# Patient Record
Sex: Female | Born: 1965 | Hispanic: No | Marital: Married
Health system: Midwestern US, Academic
[De-identification: ages and names within clinical notes are randomized; demographics above are authoritative.]

---

## 2006-06-24 NOTE — Unmapped (Signed)
Signed by Nestor Lewandowsky MD on 06/24/2006 at 00:00:00  MRI Brain      Imported By: Maryellen Pile 07/18/2006 14:07:46    _____________________________________________________________________    External Attachment:    Please see [employer] EMR for this document.

## 2006-07-12 NOTE — Unmapped (Signed)
Signed by Geanie Berlin MA on 07/12/2006 at 13:55:53    Neurology Preload      Preload Clinical Lists   Medications added:   TRIPHASIL (28) TABS (LEVONORG-ETH ESTRAD TRIPHASIC) daily  AVONEX 30 MCG/VIAL KIT (INTERFERON BETA-1A) 1 every week        Coordinating Care Providers   PCP Name: Dr. Doyce Loose  PCP Address: [address] [place], [place] [address]  Referring Physician: Dr. Doyce Loose    Past History  Past Medical History:  Migraines  Surgical History:  Tonsillectomy: 40 years old, No problems with anesthesia, No problems with healing, No problems with blood clots after surgery, Bunion surgery on left foot in 2004    Family History: Positive for Rheumatiod Arthritis and Migraines.  Social History: Employment Status: employed part-time,   Patient Lives at: home,   Seatbelt Use: 100 % of the time  Alcohol Use: none  Drug Use: none  Tobacco Usage:non-smoker      Diagnostic Testing:   MRI History MRI of the Brain with and without contrast on 06-24-2006.     Stable findings suggesting a demyelinating process such as multiple sclerosis.  Findings are stable compared to 08-02-05.          ]

## 2006-07-14 NOTE — Unmapped (Signed)
Signed by Nestor Lewandowsky MD on 07/14/2006 at 00:00:00  Medical Records Release      Imported By: Maryellen Pile 07/18/2006 11:54:20    _____________________________________________________________________    External Attachment:    Please see [facility] EMR for this document.

## 2006-07-14 NOTE — Unmapped (Signed)
Signed by Nestor Lewandowsky MD on 07/14/2006 at 12:01:19    Multiple Sclerosis Visit      History of Present Illness- Multiple Sclerosis:   Chief Complaint: MS  Handedness: right  HPI: Ms. Shoaff is a 40 yo wf who was diagnosed with MS 9 1/2 years ago. At that time she had some left leg weakness and right hand clumsiness. She had an MRI of the brain and a lumbar puncture at that time and was diagnosed with multiple sclerosis.  The first attack resolved following steroid treatment and she was started on Avonex.  She had no problems until 3 weeks ago when she developed right face, shoulder and arm numbness as well as left sided numbness. She has been followed by Dr. Yevonne Aline since moving to [place], and she was treated with IV steroids and is currently on an oral steroid taper. The patient states that her symptoms are much better, but that she does have odd sensations of heat and cold in her left arm.   Past History  Past Medical History (reviewed - no changes required):  Migraines  Surgical History (reviewed - no changes required):  Tonsillectomy: 40 years old, No problems with anesthesia, No problems with healing, No problems with blood clots after surgery, Bunion surgery on left foot in 2004    Family History (reviewed - no changes required): Positive for Rheumatiod Arthritis and Migraines.  Social History (reviewed - no changes required): Employment Status: employed part-time,   Patient Lives at: home,   Seatbelt Use: 100 % of the time  Alcohol Use: none  Drug Use: none  Tobacco Usage:non-smoker    Review of Systems   General: Denies fevers, chills, sweats, anorexia, fatigue, malaise, weight loss, weight gain, drowsiness, insomnia.   Eyes: Denies blurring, diplopia, irritation, discharge, vision loss, eye pain, photophobia, redness.   Ears/Nose/Throat: Denies any specific issues at this time.   Cardiovascular: Denies any specific issues at this time.   Respiratory: Denies any specific issues at this time.      Gastrointestinal: Denies any specific issues at this time.   Genitourinary: Denies any specific issues at this time.   Musculoskeletal: Denies any specific issues at this time.   Skin: Denies any specific issues at this time.   Neurologic: Denies see HPI, headache, paresthesias, tingling.   Psychiatric: Denies anxiety.   Endocrine: Denies any specific issues at this time.   Heme/Lymphatic: Denies any specific issues at this time.   Allergic/Immunologic: Denies any specific issues at this time.       Intake-Neurology   Chief Complaint: MS  Handedness: right    Vital Signs Weight: 117 pounds  Pulse rate: 60 Pulse rhythm: regular Blood Pressure: Standard    BP #1: 98 / 70mm Hg  Cuff Size: Std     Visual Exam:   Corrective Lenses: none    Allergies  No Known Allergies    Intake recorded by: Geanie Berlin MA  July 14, 2006 9:08 AM      Diagnostic Testing:   MRI History MRI of the Brain with and without contrast on 06-24-2006.     Stable findings suggesting a demyelinating process such as multiple sclerosis.  Findings are stable compared to 08-02-05.        Physical Exam-Neurology   Appearance: well developed, well nourished and in no acute distress  Language/Speech: no aphasia and no dysarthria  Atten/concentration: awake and alert  Orientation: oriented x3  Memory: grossly intact  Fund of Knowledge: grossly  intact  Ophthalmoscopic: no papilledema/good venous pulsations  Pupils: equal, round, reactive to light and accommodation  Visual Fields:   normal  CN 3,4,6 (EOM): extraocular movements intact, normal gaze alignment, and no nystagmus  Cranial Nerve 5 (Trigeminal): bilateral facial sensation normal  Cranial Nerve 7 (Facial): normal facial symmetry and strength  CN 8 (Auditory): proper hearing to finger rub bilaterally  Cranial Nerve 9 (Glossophar): soft palate elevates in the midline spontaneously  CN 11 (spinal access): full shoulder shrug equally  Cranial Nerve 12 (Hypoglossal): tongue is midline on  protrusion    General:   Head and Neck no lymphadenopathy, carotids 2+/no bruits, nl thyroid gland  Pulmonary: clear to ausculation  CVS: RRR, nl S1 S2, no S3 S4, no M  Abdomen: soft, no tenderness, no organomegaly  Extremities: no edema, cynosis, clubbing, pulses 2+    Muscle Strength     Right Upper Extremity   Shoulder abductor (right): 5/5  Elbow flexor (right): 5/5  Elbow extensor (right): 5/5  Wrist Flexors (right): 5/5  Wrist Extensors (right): 5/5  Finger Flexors (right): 5/5  Interosseous (right): 5/5  APB (right): 5/5    Left Upper Extremity   Shoulder abductor (left): 5/5  Elbow flexor (left): 5/5  Elbow extensor (left): 5/5  Wrist Flexors (left): 5/5  Wrist Extensors (left): 5/5  Finger Flexors (left): 5/5  Interosseous (left): 5/5  APB (left): 5/5    Right Lower Extremity   Hip Flexion (right): 5/5  Hip Adduction (right): 5/5  Hip Abduction (right): 5/5  Knee extension (right): 5/5  Knee Flexion (right): 5/5  Ankle Dorsi Flexor (right): 5/5  Pedal Flexor (right): 5/5  Ankle Inverter (right): 5/5  Ankle Everter (right): 5/5    Left Lower Extremity   Hip Flexion (left): 5/5  Hip Adduction(left): 5/5  Hip Abduction (left): 5/5  Knee extension (left): 5/5  Knee Flexion (left): 5/5  Ankle Dorsi Flexor (left): 5/5  Pedal Flexors (left): 5/5  Ankle Inverter (left): 5/5  Ankle Everter (left): 5/5      Assessment and Plan  New Problems:  Dx of MULTIPLE SCLEROSIS (ICD-340)  Onset: 07/14/2006  Assessment    40 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 Her symptoms have now almost resolved following a course of steroids.     Plan    1. Diagnostics - we will check an MRI of the spine with contrast to determine whether there are spinal lesions and determine whether there is active disease in the spinal cord.   2. Treatment - we will continue Avonex for now, but if the spinal MRI demonstrates contrast-enhancing lesions consistent with active disease, we will consider  changing to Rebif.  3. Follow-up - Mrs. Yeager would prefer not to return to the office for her MRI results, so her husband will call to obtain these and be instructed regarding changes in management at that time. We have asked Mrs. Scheeler to follow up in clinic in 6 months,    Today's Orders   [phone] - Ofc Vst, Est Level V [CPT-99215]    Disposition/Follow Up:   Return to clinic in 6 months  Suggested date of next visit: 01/12/2007              ]

## 2006-07-15 NOTE — Unmapped (Signed)
Signed by Geanie Berlin MA on 07/15/2006 at 10:00:05    Clinical Lists Changes    Orders:  Added new Test order of MRI, Spine, Cervical w/ & w/o gadolinium ([id number]) - Signed  Added new Test order of MRI, Spine, Thoracic, w/ & w/o contrast ([id number]) - Signed

## 2006-07-15 NOTE — Unmapped (Signed)
Signed by Geanie Berlin MA on 07/15/2006 at 09:40:05    Clinical Lists Changes    Orders:  Added new Test order of CT, Spine, Cervical , w/ & w/o contrast ([id number]) - Signed  Added new Test order of CT, Spine, Thoracic,  w/ & w/o contrast ([id number]) - Signed    Called patients insurance and per [facility] at insurance no precert required.  To be done at [facility] on 07-29-06 at 12:30pm.

## 2006-07-18 NOTE — Unmapped (Signed)
Signed by Nestor Lewandowsky MD on 07/18/2006 at 00:00:00  MS Patient Questionnaire      Imported By: Maryellen Pile 07/18/2006 11:43:25    _____________________________________________________________________    External Attachment:    Please see [facility] EMR for this document.

## 2006-07-20 NOTE — Unmapped (Signed)
Signed by Lyla Son on 07/20/2006 at 19:41:23                    July 14, 2006          Doyce Loose, M.D.    RE: Haley Ramos   DOB:  1965-12-25    Dear Dr. Dutch Gray:     I had the pleasure of seeing your patient, Haley Ramos, in consultation in the [facility] on 07/14/2006.     History of Present Illness:  Haley Ramos is a 40 year old white female who was diagnosed with MS 9-1/2 years ago. At that time she had some left leg weakness and right hand clumsiness. She had an MRI of the brain and a lumbar puncture at that time and was diagnosed with multiple sclerosis.  The first attack resolved following steroid treatment and she was started on Avonex.  She had no problems until 3 weeks ago when she developed right face, shoulder and arm numbness as well as left-sided numbness. She has been followed by Dr. Yevonne Aline since moving to [place], and she was treated with IV steroids and is currently on an oral steroid taper. The patient states that her symptoms are much better, but that she does have odd sensations of heat and cold in her left arm.        Impression:  40 year old female with relapsing-remitting MS presents with her first flare in over 9 years. Her symptoms have now almost resolved following a course of steroids.     Plan:  1. Diagnostics - we will check an MRI of the spine with contrast to determine whether there are spinal lesions and determine whether there is active disease in the spinal cord.   2. Treatment - we will continue Avonex for now, but if the spinal MRI demonstrates contrast-enhancing lesions consistent with active disease, we will consider changing to Rebif.  3. Follow-up - Haley Ramos would prefer not to return to the office for her MRI results, so her husband will call to obtain these and be instructed regarding changes in management at that time. We have asked Haley Ramos to follow up in clinic in 6 months.      Thank  you for allowing me to participate in the care of this pleasant patient.  If you would like a copy of my office note, please contact our Medical Records department at [phone].  Please feel free to contact me should you have any concerns or questions.    Sincerely,      Hulan Fess, MD

## 2006-07-29 ENCOUNTER — Inpatient Hospital Stay

## 2006-07-29 NOTE — Unmapped (Signed)
Signed by   [employer] on 07/29/2006 at 17:37:04  Patient: Haley Ramos  Note: All result statuses are Final unless otherwise noted.    Tests: (1) MRI-C-SPINE W/WO CONTRAST [phone])    Order NotePricilla Handler Order Number: [id number]    Order Note:     *** VERIFIED ***  [facility]  Reason:  C & T SPINE W/WO DX. [facility])  Dict.Staff: Sheral Apley [phone]  Dict.Res: Arnoldo Hooker [id number]  Verified By: Sheral Apley        Ver: 07/29/06   5:39 pm  Exams:  MRI-C-SPINE W/WO CONTRAST  MRI-T-SPINE W/WO CONTRAST    STUDY: MRI CERVICAL SPINE WITH AND WITHOUT CONTRAST    History: Multiple sclerosis.    Comparison: None available.    Technique: Multisequence sagittal and axial images of the  cervical spine were obtained from the skull base to the T3-T4  level.    Findings: There are 2 T2 hyperintense lesions in the cervical  cord. The lesion at the cervicomedullary junction on the right  side of the cord measure 1 x 0.6 cms and demonstrates contrast  enhancement. The other lesion is at the C3-C4 level and does not  enhance.    Vertebral body height and alignment is normal. The bone marrow  signal is normal. Prevertebral soft tissues are normal.    At C5-C6: There is a small broad based left paracentral disc  protrusion that compresses the ventrolateral thecal sac on the  left. There is no spinal canal or foraminal stenosis.    In the remainder of the cervical spine: The disc is normal.  There is no spinal canal/foraminal stenosis or nerve  compression.    Impression:  1. Two hyperintense T2 lesions in the cervical cord at the  cervicomedullary junction and at the C3-C4 level, with the  cervicomedullary junction lesion demonstrating enhancement  consistent with multiple sclerosis plaques.  2. Disc protrusion at C5-C6 as described above.      STUDY: MRI THORACIC SPINE WITH AND WITHOUT CONTRAST    History: Multiple sclerosis    Comparison: None available.    Technique: Multisequence sagittal and axial images of  the  thoracic spine were obtained from the C7-T1 to T12-L1.    Findings: Vertebral body height and alignment is normal. The  bone marrow signal is normal.  There is no definite abnormal T2  signal in the thoracic cord. Paravertebral soft tissues are  normal.    At T7-T8: There is a small central right disc  protrusion/extrusion with some suggestion of superior extension  that results in ventral thecal sac compression. There is no  spinal canal or foraminal stenosis.    In the remainder of the thoracic spine: The disc is normal.  There is no spinal canal/foraminal stenosis or nerve  compression.    Impression:  1. Small disc protrusion/extrusion at T7-T8.  2. No definite abnormal signal in the thoracic cord.                                            **** end of result ****    Note: An exclamation mark (!) indicates a result that was not dispersed into   the flowsheet.  Document Creation Date: 07/29/2006 5:37 PM  _______________________________________________________________________    (1) Order result status: Final  Collection or observation date-time: 07/29/2006 14:43:15  Requested date-time: 07/29/2006 13:00:00  Receipt date-time:   Reported date-time: 07/29/2006 17:39:14  Referring Physician: Trudee Grip  Ordering Physician: Hulan Fess [facility])  Specimen Source:   Source: QRS  Filler Order Number: [id number]  Lab site: [facility]

## 2006-07-29 NOTE — Unmapped (Signed)
Signed by   [employer] on 07/29/2006 at 17:37:05  Patient: Haley Ramos  Note: All result statuses are Final unless otherwise noted.    Tests: (1) MRI-T-SPINE W/WO CONTRAST [id number])    Order NotePricilla Handler Order Number: [id number]    Order Note:     *** VERIFIED ***  [facility]  Reason:  C & T SPINE W/WO DX. [facility])  Dict.Staff: Sheral Apley [phone]  Dict.Res: Arnoldo Hooker [id number]  Verified By: Sheral Apley        Ver: 07/29/06   5:39 pm  Exams:  MRI-C-SPINE W/WO CONTRAST  MRI-T-SPINE W/WO CONTRAST    STUDY: MRI CERVICAL SPINE WITH AND WITHOUT CONTRAST    History: Multiple sclerosis.    Comparison: None available.    Technique: Multisequence sagittal and axial images of the  cervical spine were obtained from the skull base to the T3-T4  level.    Findings: There are 2 T2 hyperintense lesions in the cervical  cord. The lesion at the cervicomedullary junction on the right  side of the cord measure 1 x 0.6 cms and demonstrates contrast  enhancement. The other lesion is at the C3-C4 level and does not  enhance.    Vertebral body height and alignment is normal. The bone marrow  signal is normal. Prevertebral soft tissues are normal.    At C5-C6: There is a small broad based left paracentral disc  protrusion that compresses the ventrolateral thecal sac on the  left. There is no spinal canal or foraminal stenosis.    In the remainder of the cervical spine: The disc is normal.  There is no spinal canal/foraminal stenosis or nerve  compression.    Impression:  1. Two hyperintense T2 lesions in the cervical cord at the  cervicomedullary junction and at the C3-C4 level, with the  cervicomedullary junction lesion demonstrating enhancement  consistent with multiple sclerosis plaques.  2. Disc protrusion at C5-C6 as described above.      STUDY: MRI THORACIC SPINE WITH AND WITHOUT CONTRAST    History: Multiple sclerosis    Comparison: None available.    Technique: Multisequence sagittal and axial images of  the  thoracic spine were obtained from the C7-T1 to T12-L1.    Findings: Vertebral body height and alignment is normal. The  bone marrow signal is normal.  There is no definite abnormal T2  signal in the thoracic cord. Paravertebral soft tissues are  normal.    At T7-T8: There is a small central right disc  protrusion/extrusion with some suggestion of superior extension  that results in ventral thecal sac compression. There is no  spinal canal or foraminal stenosis.    In the remainder of the thoracic spine: The disc is normal.  There is no spinal canal/foraminal stenosis or nerve  compression.    Impression:  1. Small disc protrusion/extrusion at T7-T8.  2. No definite abnormal signal in the thoracic cord.                                            **** end of result ****    Note: An exclamation mark (!) indicates a result that was not dispersed into   the flowsheet.  Document Creation Date: 07/29/2006 5:37 PM  _______________________________________________________________________    (1) Order result status: Final  Collection or observation date-time: 07/29/2006 14:43:08  Requested date-time: 07/29/2006 13:00:00  Receipt date-time:   Reported date-time: 07/29/2006 17:39:14  Referring Physician: Trudee Grip  Ordering Physician: Hulan Fess [facility])  Specimen Source:   Source: QRS  Filler Order Number: [id number]  Lab site: [facility]

## 2006-08-02 NOTE — Unmapped (Signed)
Signed by Geanie Berlin MA on 08/02/2006 at 16:10:10    Phone Note   Patient Call    Caller's Cell Phone #: [phone]  Caller: spouse [facility]  Call for: [facility]    Follow-up for Phone Call      Per the dictation she has enhancing plaques in the spine.  Per the pt's OV note she chose not to come in to discuss the MRI results and discussed the plan that her husband would call to see if there were signs of active disease with the understanding that Dr Grayland Jack recommended switching from Avonex to Rebif if active disease was noted.     I'm assuming all of this was discussed and agreed upon.    Thanks!             Information sent  Follow-up by: Noelle Penner NP,  August 02, 2006 3:37 PM    Reason for call: PT'S HUSBAND CALLING TO SPEAK WITH THE NURSE STATING THEY WERE TOLD TO CALL BACK AFTER ABOUT A WEEK OR SO FOR THE RESULTS OF THE PT'S MRI.     Requesting Test Results:  Test/ Procedure: MRI   Date of test/ procedure: 07/29/06.      Initial call taken by: Marinell Blight,  August 02, 2006 3:06 PM      Follow-up for Phone Call   Please see MRI results and office note and please let me know what to tll the patient.  Thanks.  Called Patient  Follow-up by: Geanie Berlin MA,  August 02, 2006 3:30 PM    Additional Follow-up for Phone Call   Called patient's husband back and gave th information to him.  He was then sent to schdule a appt with Dr. Grayland Jack for a follow-up from the MRI.  Information to start the patient on Rebif was faxed to the company.  Phone Call Completed, Called Patient  Additional Follow-up by: Geanie Berlin MA,  August 02, 2006 4:09 PM

## 2006-08-02 NOTE — Unmapped (Signed)
Signed by Nestor Lewandowsky MD on 08/02/2006 at 00:00:00  Rebif Prescription Form      Imported By: Maryellen Pile 08/15/2006 10:13:09    _____________________________________________________________________    External Attachment:    Please see [facility] EMR for this document.

## 2006-08-03 NOTE — Unmapped (Signed)
Signed by Geanie Berlin MA on 08/03/2006 at 09:30:29    Phone Note   Patient Call    Caller's Cell Phone #: [phone]  Caller: spouse [facility]  Call for: [facility]      Summary of Call:  PTS HUSBAND STATED THAT HE WOULD LIKE TO GET MORE INFO/UNDERSTANDING REGARDING PTS MRI OF SPINE.  STATED THAT PT HAS BEEN EXTREMELY UPSET SINCE TEST RESULTS. PLEASE CALL CELL NUMBER.     Initial call taken by: Blanch Media,  August 03, 2006 8:43 AM      New Orders:  CBC + plat + Diff  (6399) [CPT-85025]  Hepatic Function    (LIVP) (10256) [CPT-80076]  Follow-up for Phone Call   Sp[oke to patient's husband and told him what was stated on the MRI.  Stated that wife was very upset with the news of the plaques in her spine, told husband understandable of this.  Patient's husband wanted to get in today to see Dr. told patient's husband that Dr. was out of town for 3 weeks and that patient was to make a appt before the Dr. was to be gone, but patient's did not want to come back and get results they wanted to be told by phone, and patient was told that we do not like to do this.  We like to have the patient's to return and see the Dr. to get the results.  Patient's husband has some questions that I could not answer so patient's husband was transferred to [facility] and she answered some questions.  Husband told Danielle that patient was on steroids that Dr. Grayland Jack put her on.  Dr. Grayland Jack did not put the patient on the steriods there were given to the patient by Dr. Yevonne Aline, and they were finished on Saturday.  They will call if they have any problems and orders for a CBC and LFT's were mailed to the patient to be done before they start the medication Rebif.  Phone Call Completed, Called Patient  Follow-up by: Geanie Berlin MA,  August 03, 2006 9:30 AM

## 2006-08-18 NOTE — Unmapped (Signed)
Signed by Nestor Lewandowsky MD on 08/18/2006 at 00:00:00  Lab Report      Imported By: Maryellen Pile 08/29/2006 13:47:05    _____________________________________________________________________    External Attachment:    Please see [employer] EMR for this document.

## 2006-09-08 NOTE — Unmapped (Signed)
Signed by Nestor Lewandowsky MD on 11/21/2006 at 09:02:37    Multiple Sclerosis Visit      History of Present Illness- Multiple Sclerosis:   Chief Complaint: 124  Handedness: right  HPI: I re-evaluated Haley Ramos at a follow-up visit in multiple sclerosis clinic on September 08, 2006. The patient is a 41 year old female who was diagnosed with multiple sclerosis many years ago. The patient came to the clinic to review her MRI results. The MRI revealed a contrast-enhancing lesion on the right side of the cord at the cervicomedullary junction as well as a lesion at the C3-4 level which is not enhancing.     The results of the test were discussed with the patient and we suggested switching her from low-dose interferon to high-dose therapy such as Rebif. The patient is agreeable.        x    x   x      Past History  Past Medical History (reviewed - no changes required):  Migraines  Surgical History (reviewed - no changes required):  Tonsillectomy: 41 years old, No problems with anesthesia, No problems with healing, No problems with blood clots after surgery, Bunion surgery on left foot in 2004    Family History (reviewed - no changes required): Positive for Rheumatiod Arthritis and Migraines.  Social History (reviewed - no changes required): Employment Status: employed part-time,   Patient Lives at: home,   Seatbelt Use: 100 % of the time  Alcohol Use: none  Drug Use: none  Tobacco Usage:non-smoker    Review of Systems       Intake-Neurology   Chief Complaint: 124  Handedness: right    Vital Signs Weight: 124 pounds  Pulse rate: 72 Pulse rhythm: regular Blood Pressure: Standard    BP #1: 110 / 72mm Hg  Cuff Size: Std Wt chg: 7      Visual Exam:   Corrective Lenses: glasses    Allergies  No Known Allergies    Intake recorded by: Geanie Berlin MA  September 08, 2006 4:03 PM      Diagnostic Testing:   MRI History MRI of the Brain with and without contrast on 06-24-2006.     Stable findings suggesting a demyelinating  process such as multiple sclerosis.  Findings are stable compared to 08-02-05.          Assessment    A 41 year old female with relapsing-remitting multiple sclerosis and a recent attack and a contrast-enhancing lesion at the cervicomedullary junction. I have suggested that the patient increase the dose of interferon because of the activity of the disease. The patient is agreeable.     x        Plan    1. Stop Avonex and initiate treatment with Rebif.   2. The patient will come back to the clinic in a few months.       x      Today's Orders   99213 - Ofc Vst, Est Level III [[id number]]    Disposition/Follow Up:   Time spent with patient: 30 minutes  Time spent discussing diagnosis, management and treatment plan: >25 minutes    Medical Decision Making:   * review/order radiology tests              ]

## 2006-09-09 NOTE — Unmapped (Signed)
Signed by Geanie Berlin MA on 09/09/2006 at 13:59:55    Phone Note   Patient Call    Call back at Home Phone: [phone]  Caller: patient  Call for: Dr. Grayland Jack    Reason for call: rebif needs rx faxed over so pt can start her meds.       Initial call taken by: Hendricks Milo,  September 09, 2006 12:48 PM      Follow-up for Phone Call   Called patietn back and after she called me about this she was called by the company about getting her started on the medication.  The company was called yesrterday and the papers were refaxed back to the company.  Patient will be starting the medication soon.                                                Phone Call Completed, Called Patient  Follow-up by: Geanie Berlin MA,  September 09, 2006 1:59 PM

## 2006-10-24 NOTE — Unmapped (Signed)
Signed by Geanie Berlin MA on 10/24/2006 at 12:46:16    Clinical Lists Changes    Orders:  Added new Test order of MRI, Head, w/ &  w/o  contrast ([id number]) - Signed  Added new Test order of MRI, Spine, Cervical, w/ & w/o contrast ([id number]) - Signed  MRI will be at [facility] on 12-05-06 at1:30pm.  No precert per Norm Salt at insurance.

## 2006-11-22 NOTE — Unmapped (Signed)
Signed by Lyla Son on 11/22/2006 at 14:18:49                    September 08, 2006          Doyce Loose, M.D.    RE: Haley Ramos   DOB:  1966/02/14    Dear Dr. Dutch Gray:     I re-evaluated Haley Ramos at a follow-up visit in multiple sclerosis clinic on September 08, 2006. The patient is a 41 year old female who was diagnosed with multiple sclerosis many years ago. The patient came to the clinic to review her MRI results. The MRI revealed a contrast-enhancing lesion on the right side of the cord at the cervicomedullary junction as well as a lesion at the C3-4 level which is not enhancing.     The results of the test were discussed with the patient and we suggested switching her from low-dose interferon to high-dose therapy such as Rebif. The patient is agreeable.     Impression:  A 40 year old female with relapsing-remitting multiple sclerosis and a recent attack and a contrast-enhancing lesion at the cervicomedullary junction. I have suggested that the patient increase the dose of interferon because of the activity of the disease. The patient is agreeable.      Plan:  1. Stop Avonex and initiate treatment with Rebif.   2. The patient will come back to the clinic in a few months.      Thank you for allowing me to participate in the care of this pleasant patient.  If you would like a copy of my office note, please contact our Medical Records department at [phone].  Please feel free to contact me should you have any concerns or questions.    Sincerely,      Hulan Fess, MD    MM/md

## 2006-12-05 ENCOUNTER — Inpatient Hospital Stay

## 2006-12-05 NOTE — Unmapped (Signed)
Signed by Nestor Lewandowsky MD on 12/14/2006 at 17:55:13    Multiple Sclerosis Visit      History of Present Illness- Multiple Sclerosis:   Chief Complaint: Follow Up Visit  Handedness: right  HPI: I re-evaluated Haley Ramos at in follow-up visit in multiple sclerosis clinic. The patient is a 41 year old female with the diagnosis of relapsing-remitting multiple sclerosis.     Since last seen in the clinic, she had an MRI of the brain and the C-spine done. The MRI of the brain revealed an enhancing lesion in the right temporal area. Otherwise it was pretty much similar to the previous MRI. There was no enhancement on the MRI of the spinal cord. The pontomedullary junction lesion is still present, but is not enhancing.    According to the patient, she is doing well. The weakness which she had in the lower extremities has improved. She continues to have a burning sensation in the left forearm which occasionally migrates to the leg. It is present almost on a daily basis. Other than that, the review of symptoms is negative.     x    x   x        Review of Systems       Intake-Neurology   Chief Complaint: Follow Up Visit  Handedness: right    Vital Signs Weight: 119 pounds    BP #1: 114 / 80mm Hg Wt chg: -5    Allergies  No Known Allergies  New Medication:  REBIF 44 MCG/0.5ML SOLN (INTERFERON BETA-1A) 1 injection SC TIW  NEURONTIN 100 MG CAPS (GABAPENTIN) 1 by mouth at bedtime and increase weekly until 100mg  TID  Meds Removed:  TRIPHASIL (28) TABS (LEVONORG-ETH ESTRAD TRIPHASIC) daily  AVONEX 30 MCG/VIAL KIT (INTERFERON BETA-1A) 1 every week    Intake recorded by: Megan Mans MA  December 05, 2006 4:10 PM      Diagnostic Testing:   MRI History MRI of the Brain with and without contrast on 06-24-2006.     Stable findings suggesting a demyelinating process such as multiple sclerosis.  Findings are stable compared to 08-02-05.          Assessment and Plan  New Problems:  Dx of AFTERCARE, LONG-TERM USE, MEDICATIONS NEC  (ICD-V58.69)  Onset: 12/05/2006    Medications   New Prescriptions/Refills:  NEURONTIN 100 MG CAPS (GABAPENTIN) 1 by mouth at bedtime and increase weekly until 100mg  TID  #270 x 3, 12/05/2006, Danielle L Wefer MA  REBIF 44 MCG/0.5ML SOLN (INTERFERON BETA-1A) 1 injection SC TIW  #36 x 3, 12/05/2006, Megan Mans MA    New medications:  REBIF 44 MCG/0.5ML SOLN -- 1 injection SC TIW  Start date: 12/05/2006  NEURONTIN 100 MG CAPS -- 1 by mouth at bedtime and increase weekly until 100mg  TID  Start date: 12/05/2006    Assessment    A 41 year old female with relapsing-remitting multiple sclerosis on treatment with Rebif after she was treated with Avonex and had significant MS attacks and an enhancing pontomedullary lesion. Currently the patient is doing much better. The MRI has improved.    x        Plan    1. Blood work consisting of a CBC and liver function tests.   2. I am also going to initiate treatment with Neurontin 100 mg at bedtime, and increase by 100 mg every week up to three times a day, for the burning sensation.   3. The patient is moving to [facility] so  I did not schedule a follow-up visit for her.       x      Today's Orders   CBC + plat + Diff  (5784) [CPT-85025]  Liver function panel  (LIVP) (10256) [CPT-80076]  99213 - Ofc Vst, Est Level III [[id number]]    Medical Decision Making:   * review/order clinical lab tests  * review/order radiology tests      Prescriptions:  NEURONTIN 100 MG CAPS (GABAPENTIN) 1 by mouth at bedtime and increase weekly until 100mg  TID  #270 x 3   Entered by: Megan Mans MA   Authorized by: Nestor Lewandowsky MD   Signed by: Megan Mans MA on 12/05/2006   Method used: Print then Give to Patient   RxID: [id number]  REBIF 44 MCG/0.5ML SOLN (INTERFERON BETA-1A) 1 injection SC TIW  #36 x 3   Entered by: Megan Mans MA   Authorized by: Nestor Lewandowsky MD   Signed by: Megan Mans MA on 12/05/2006   Method used: Print then Give to  Patient   RxID: [id number]            ]

## 2006-12-05 NOTE — Unmapped (Signed)
Signed by   LinkLogic on 12/06/2006 at 08:49:12  Patient: Haley Ramos  Note: All result statuses are Final unless otherwise noted.    Tests: (1) MRI-C-SPINE W/WO CONTRAST [phone])    Order NotePricilla Handler Order Number: [id number]    Order Note:     *** VERIFIED ***  MEDICAL ARTS BUILDING  Reason:  BRAIN & C-SPINE W/WO (WADDELL PROTOCOL); HX OF MS  Dict.Staff: Sheral Apley [phone]  Dict.Res: Marjo Bicker [id number]  Verified By: Sheral Apley        Ver: 12/06/06   8:48 am  Exams:  MRI-C-SPINE W/WO CONTRAST  MRI-HEAD W/WO CONTRAST    MRI head and cervical spine without and with contrast 12/05/2006    Indication: Multiple sclerosis.    Comparison: 07/29/2006    Technique: Multisequence, multiplanar MRI images of the head and  cervical spine were performed. Images were obtained prior to and  after administration of 12 mL of Magnevist.    Brain MRI:  No acute intracranial hemorrhage or mass lesion is seen. No  areas of restricted diffusion are seen to suggest acute  infarction. There is moderate patchy white matter high T2 and  FLAIR signal. Foci of high signal are present in the mid right  and posterior left corpus callosum, left greater than right  brachium pontis and spinomedullary junction. All are stable  compared to the previous examination and none demonstrate  enhancement on the post-contrast study. The ventricles and sulci  are normal. Intracranial flow voids are normal.    Impression brain MRI:  Stable moderate white matter high T2/FLAIR foci in a pattern  consistent with known history of multiple sclerosis.      Cervical spine:  High T2/FLAIR signal foci are again seen at the spinomedullary  junction as well as in the cord at the C3-C4 level. These areas  are slightly less apparent than on the previous exam and do not  demonstrate enhancement postcontrast. Subtle cord signal  abnormality is also seen at the C5-C6 level, which in  retrospect, may have been present on the previous exam, though  more  apparent on today's study. There is subtle, patchy  enhancement at C5-6 postcontrast, consistent with some activity.    Mild cervical spondylosis is present as follows:  At C4-C5, there is a minimal diffuse disc bulge.  At C5-C6, there is a mild left paracentral protrusion which  appears decreased compared to the previous study.    Impression cervical spine:  Overall improvement of cervical cord lesions as described above  which are consistent with the patient's history of multiple  sclerosis  **** end of result ****    Note: An exclamation mark (!) indicates a result that was not dispersed into   the flowsheet.  Document Creation Date: 12/06/2006 8:49 AM  _______________________________________________________________________    (1) Order result status: Final  Collection or observation date-time: 12/05/2006 14:30:54  Requested date-time: 12/05/2006 14:00:00  Receipt date-time:   Reported date-time: 12/06/2006 08:48:54  Referring Physician: Cindi Carbon NON-STAFF  Ordering Physician: Hulan Fess [facility])  Specimen Source:   Source: QRS  Filler Order Number: [id number]  Lab site: [facility]

## 2006-12-05 NOTE — Unmapped (Signed)
Signed by   LinkLogic on 12/06/2006 at 08:49:09  Patient: Haley Ramos  Note: All result statuses are Final unless otherwise noted.    Tests: (1) MRI-HEAD W/WO CONTRAST [phone])    Order NotePricilla Handler Order Number: [id number]    Order Note:     *** VERIFIED ***  MEDICAL ARTS BUILDING  Reason:  BRAIN & C-SPINE W/WO (WADDELL PROTOCOL); HX OF MS  Dict.Staff: Sheral Apley [phone]  Dict.Res: Marjo Bicker [id number]  Verified By: Sheral Apley        Ver: 12/06/06   8:48 am  Exams:  MRI-C-SPINE W/WO CONTRAST  MRI-HEAD W/WO CONTRAST    MRI head and cervical spine without and with contrast 12/05/2006    Indication: Multiple sclerosis.    Comparison: 07/29/2006    Technique: Multisequence, multiplanar MRI images of the head and  cervical spine were performed. Images were obtained prior to and  after administration of 12 mL of Magnevist.    Brain MRI:  No acute intracranial hemorrhage or mass lesion is seen. No  areas of restricted diffusion are seen to suggest acute  infarction. There is moderate patchy white matter high T2 and  FLAIR signal. Foci of high signal are present in the mid right  and posterior left corpus callosum, left greater than right  brachium pontis and spinomedullary junction. All are stable  compared to the previous examination and none demonstrate  enhancement on the post-contrast study. The ventricles and sulci  are normal. Intracranial flow voids are normal.    Impression brain MRI:  Stable moderate white matter high T2/FLAIR foci in a pattern  consistent with known history of multiple sclerosis.      Cervical spine:  High T2/FLAIR signal foci are again seen at the spinomedullary  junction as well as in the cord at the C3-C4 level. These areas  are slightly less apparent than on the previous exam and do not  demonstrate enhancement postcontrast. Subtle cord signal  abnormality is also seen at the C5-C6 level, which in  retrospect, may have been present on the previous exam, though  more apparent  on today's study. There is subtle, patchy  enhancement at C5-6 postcontrast, consistent with some activity.    Mild cervical spondylosis is present as follows:  At C4-C5, there is a minimal diffuse disc bulge.  At C5-C6, there is a mild left paracentral protrusion which  appears decreased compared to the previous study.    Impression cervical spine:  Overall improvement of cervical cord lesions as described above  which are consistent with the patient's history of multiple  sclerosis  **** end of result ****    Note: An exclamation mark (!) indicates a result that was not dispersed into   the flowsheet.  Document Creation Date: 12/06/2006 8:49 AM  _______________________________________________________________________    (1) Order result status: Final  Collection or observation date-time: 12/05/2006 14:31:01  Requested date-time: 12/05/2006 14:00:00  Receipt date-time:   Reported date-time: 12/06/2006 08:48:54  Referring Physician: Cindi Carbon NON-STAFF  Ordering Physician: Hulan Fess [facility])  Specimen Source:   Source: QRS  Filler Order Number: [id number]  Lab site: [facility]

## 2006-12-08 NOTE — Unmapped (Signed)
Signed by Nestor Lewandowsky MD on 12/08/2006 at 00:00:00  Lab Report      Imported By: Heath Gold 12/20/2006 15:46:21    _____________________________________________________________________    External Attachment:    Please see [facility] EMR for this document.

## 2006-12-15 NOTE — Unmapped (Signed)
Signed by Lyla Son on 12/15/2006 at 19:30:39                    December 05, 2006          Doyce Loose, M.D.    RE: Haley Ramos   DOB:  [birth year]    Dear Dr. Dutch Gray:     I re-evaluated [name] at in follow-up visit in multiple sclerosis clinic. The patient is a 41 year old female with the diagnosis of relapsing-remitting multiple sclerosis.     Since last seen in the clinic, she had an MRI of the brain and the C-spine done. The MRI of the brain revealed an enhancing lesion in the right temporal area. Otherwise it was pretty much similar to the previous MRI. There was no enhancement on the MRI of the spinal cord. The pontomedullary junction lesion is still present, but is not enhancing.    According to the patient, she is doing well. The weakness which she had in the lower extremities has improved. She continues to have a burning sensation in the left forearm which occasionally migrates to the leg. It is present almost on a daily basis. Other than that, the review of symptoms is negative.     Impression:  A 41 year old female with relapsing-remitting multiple sclerosis on treatment with Rebif after she was treated with Avonex and had significant MS attacks and an enhancing pontomedullary lesion. Currently the patient is doing much better. The MRI has improved.     Plan:  1. Blood work consisting of a CBC and liver function tests.   2. I am also going to initiate treatment with Neurontin 100 mg at bedtime, and increase by 100 mg every week up to three times a day, for the burning sensation.   3. The patient is moving to [facility] so I did not schedule a follow-up visit for her.      Thank you for allowing me to participate in the care of this pleasant patient.  If you would like a copy of my office note, please contact our Medical Records department at [phone].  Please feel free to contact me should you have any concerns or questions.    Sincerely,      Hulan Fess, MD    MM/md

## 2007-04-19 NOTE — Unmapped (Signed)
Signed by Seymour Bars MA on 04/20/2007 at 09:48:07    ---- Converted from flag ----  ---- 04/19/2007 9:00 AM, Seymour Bars MA wrote:      ---- 04/19/2007 8:25 AM, Seymour Bars MA wrote:  Tresa Endo do you recommend patient to have another MRI before she comes back in for October follow up.  Her last MRI was in 4/08? Thanks KK    ---- 04/18/2007 8:29 AM, Geanie Berlin MA wrote:      ---- 01/16/2007 10:08 AM, Geanie Berlin MA wrote:  Patient Call    Call back at Home Phone: [phone]  Caller: patient  Call for: [facility]      Summary of Call:  PT CALLING STATING SHE'S SCHEDULED HER APPT WITH THE DR. FOR 10.02.08 AND NEEDS TO SCHEDULE HER MRI BEFOREHAND.  ------------------------------      LEFT MESSAGE TO CALL OFFICE kk    SPOKE WITH PATIENT WILL RESCHEDULE HER APPT IN DECEMBER WILL REPEAT MRI IN [place] KK

## 2007-06-02 NOTE — Unmapped (Signed)
Signed by Seymour Bars MA on 06/02/2007 at 13:06:40    Phone Note   Patient Call    Call back at Home Phone: [phone]  Caller: patient  Call for: Alexzavier Girardin    Follow-up for Phone Call   patient is scheduled for mri 3T 06/19/07 no pre-cert required per Rob at [employer] Completed, Called Patient  Follow-up by: Seymour Bars MA,  June 02, 2007 1:06 PM        Summary of Call:  Pt calling to speak with the nurse to schedule her MRI.      Initial call taken by: Marinell Blight,  June 02, 2007 9:34 AM      New Orders:  MRI, Brain w/ and w/o gadolinium [CPT-70559]  MRI, Spine, Cervical, w/ & w/o contrast [CPT-72156]

## 2007-06-19 ENCOUNTER — Inpatient Hospital Stay

## 2007-06-19 NOTE — Unmapped (Signed)
Signed by   LinkLogic on 06/19/2007 at 15:23:47  Patient: Haley Ramos  Note: All result statuses are Final unless otherwise noted.    Tests: (1) MRI-C-SPINE W/WO CONTRAST [phone])    Order NotePricilla Handler Order Number: [id number]    Order Note:     *** VERIFIED ***  MEDICAL ARTS BUILDING  Reason:  BRAIN & C-SPINE W/WO; HX OF MS  Dict.Staff: Corliss Marcus [phone]  Dict.Res: Sherlyn Lick [id number]  Verified By: Corliss Marcus   Ver: 06/19/07   3:23 pm  Exams:  MRI-C-SPINE W/WO CONTRAST  MRI-HEAD W/WO CONTRAST    MRI of the brain and cervical spine with and without contrast  dated 06/19/2007    Indication: Multiple sclerosis    Comparison: 12/05/2006    Technical Factors: Standard multiplanar multisequence MRI of the  brain and cervical spine was performed before and after  intravenous infusion of 11 mL of Magnevist contrast.    Findings:    Midline structures are within normal limits.    Moderate patchy high T2 and FLAIR signal seen in the subcortical  and periventricular white matter, unchanged from prior  examination. Foci of high signal within the mid right body of  the corpus callosum and left splenium of the corpus callosum are  similar in appearance to the prior examination. Patchy signal  abnormality in the brachium pontis bilaterally, left greater  than right is also unchanged. Lesion involving the posterior  limb of the left internal capsule is also unchanged. A few these  lesions demonstrate low T1 signal and may represent early black  hole formation. No abnormal areas of enhancement are identified.    There is no evidence of mass or hemorrhage.  No diffusion  restriction is demonstrated.  The ventricles, sulci, and  cisterns appear normal.    The intracranial arterial and venous flow-voids are normal.  The  orbits and paranasal sinuses appear normal.    There is normal alignment at the craniocervical junction.    The vertebrae demonstrate normal bone marrow signal and  alignment.  Degenerative bone  marrow endplate signal is present  at multiple levels.    Subtle areas of signal abnormality involving the right aspect of  the spinal cord at the C3-C4 level as well as at spinal  medullary junction are similar in appearance to the prior  examination. The cervical spinal cord otherwise demonstrates  normal signal and morphology  There is no abnormal fluid  collection or mass lesion.    At C4-C5, there is minimal diffuse disc bulge without canal or  foraminal stenosis.    At C5-C6, there is paracentral disc protrusion which mildly  effacing the ventral thecal sac without cord compression. No  foraminal stenosis present. Other levels does not demonstrate  canal or foraminal stenosis.    Impression:    Brain:  Stable appearance of scattered white matter lesions as detailed  above, consistent with given history of multiple sclerosis.    Cervical spine:  Stable appearance of cervical spinal cord lesions as detailed  above, consistent with given history of multiple sclerosis.  **** end of result ****    Note: An exclamation mark (!) indicates a result that was not dispersed into   the flowsheet.  Document Creation Date: 06/19/2007 3:23 PM  _______________________________________________________________________    (1) Order result status: Final  Collection or observation date-time: 06/19/2007 14:14:05  Requested date-time: 06/19/2007 13:00:00  Receipt date-time:   Reported date-time: 06/19/2007 15:23:49  Referring Physician: Trudee Grip  Ordering Physician: Hulan Fess [facility])  Specimen Source:   Source: QRS  Filler Order Number: [id number]  Lab site: [facility]

## 2007-06-19 NOTE — Unmapped (Signed)
Signed by   LinkLogic on 06/19/2007 at 15:23:45  Patient: Haley Ramos  Note: All result statuses are Final unless otherwise noted.    Tests: (1) MRI-HEAD W/WO CONTRAST [phone])    Order NotePricilla Handler Order Number: [id number]    Order Note:     *** VERIFIED ***  [facility]  Reason:  BRAIN & C-SPINE W/WO; HX OF MS  Dict.Staff: Corliss Marcus [phone]  Dict.Res: Sherlyn Lick [id number]  Verified By: Corliss Marcus   Ver: 06/19/07   3:23 pm  Exams:  MRI-C-SPINE W/WO CONTRAST  MRI-HEAD W/WO CONTRAST    MRI of the brain and cervical spine with and without contrast  dated 06/19/2007    Indication: Multiple sclerosis    Comparison: 12/05/2006    Technical Factors: Standard multiplanar multisequence MRI of the  brain and cervical spine was performed before and after  intravenous infusion of 11 mL of Magnevist contrast.    Findings:    Midline structures are within normal limits.    Moderate patchy high T2 and FLAIR signal seen in the subcortical  and periventricular white matter, unchanged from prior  examination. Foci of high signal within the mid right body of  the corpus callosum and left splenium of the corpus callosum are  similar in appearance to the prior examination. Patchy signal  abnormality in the brachium pontis bilaterally, left greater  than right is also unchanged. Lesion involving the posterior  limb of the left internal capsule is also unchanged. A few these  lesions demonstrate low T1 signal and may represent early black  hole formation. No abnormal areas of enhancement are identified.    There is no evidence of mass or hemorrhage.  No diffusion  restriction is demonstrated.  The ventricles, sulci, and  cisterns appear normal.    The intracranial arterial and venous flow-voids are normal.  The  orbits and paranasal sinuses appear normal.    There is normal alignment at the craniocervical junction.    The vertebrae demonstrate normal bone marrow signal and  alignment.  Degenerative bone  marrow endplate signal is present  at multiple levels.    Subtle areas of signal abnormality involving the right aspect of  the spinal cord at the C3-C4 level as well as at spinal  medullary junction are similar in appearance to the prior  examination. The cervical spinal cord otherwise demonstrates  normal signal and morphology  There is no abnormal fluid  collection or mass lesion.    At C4-C5, there is minimal diffuse disc bulge without canal or  foraminal stenosis.    At C5-C6, there is paracentral disc protrusion which mildly  effacing the ventral thecal sac without cord compression. No  foraminal stenosis present. Other levels does not demonstrate  canal or foraminal stenosis.    Impression:    Brain:  Stable appearance of scattered white matter lesions as detailed  above, consistent with given history of multiple sclerosis.    Cervical spine:  Stable appearance of cervical spinal cord lesions as detailed  above, consistent with given history of multiple sclerosis.  **** end of result ****    Note: An exclamation mark (!) indicates a result that was not dispersed into   the flowsheet.  Document Creation Date: 06/19/2007 3:23 PM  _______________________________________________________________________    (1) Order result status: Final  Collection or observation date-time: 06/19/2007 14:14:21  Requested date-time: 06/19/2007 13:00:00  Receipt date-time:   Reported date-time: 06/19/2007 15:23:49  Referring Physician: Trudee Grip  Ordering Physician: Hulan Fess [facility])  Specimen Source:   Source: QRS  Filler Order Number: [id number]  Lab site: [facility]

## 2007-07-10 NOTE — Unmapped (Signed)
Signed by Nestor Lewandowsky MD on 07/20/2007 at 12:17:31    Multiple Sclerosis Visit      History of Present Illness- Multiple Sclerosis:   Chief Complaint: follow up  Handedness: right  HPI: I had the pleasure of re-evaluating Haley Ramos at a follow-up visit in multiple sclerosis clinic. The patient has the diagnosis of relapsing-remitting multiple sclerosis and came in today to review the MRIs of the brain and C-spine.     The MRI of the brain revealed areas of increased T2 and FLAIR signal intensity in periventricular and callosal areas. There was no enhancement noted. The lesions appeared the same as on the previous MRI. 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 There was no enhancement.    The patient is doing well with no new symptoms. She has just some intermittent sensory paresthesias such as a burning sensation in the left arm. She also complained of anxiety and panic attacks. I have discussed that with the patient and she is planning to see her PCP regarding the possibilities for management of her anxiety.     x  x x        Review of Systems       Intake-Neurology   Chief Complaint: follow up  Handedness: right    Vital Signs Weight: 119 pounds  Pulse rate: 62 Pulse rhythm: regular Blood Pressure: Standard    BP #1: 122 / 64mm Hg  Cuff Size: Std Wt chg: 0    Allergies  No Known Allergies  Meds Removed:  NEURONTIN 100 MG CAPS (GABAPENTIN) 1 by mouth at bedtime and increase weekly until 100mg  TID    Intake recorded by: Yves Dill MA  July 10, 2007 9:13 AM      Diagnostic Testing:   MRI History MRI of the Brain with and without contrast on 06-24-2006.     Stable findings suggesting a demyelinating process such as multiple sclerosis.  Findings are stable compared to 08-02-05.          Assessment and Plan  New Problems:  Dx of TINGLING, NUMBNESS (SYMPTOM) (ICD-782.0)  Onset: 07/10/2007  Assessment    A 41 year old female with  relapsing-remitting multiple sclerosis on immunomodulatory therapy with Rebif and doing well. The MRIs are stable.    x        Plan    1. Obtain a CBC and liver function tests for safety monitoring.   2. The patient is to address the issue of anxiety and panic attacks with her primary care doctor.  3. Return to clinic in six months.      x    Today's Orders   CBC + plat + Diff  (6399) [CPT-85025]  Liver function panel  (LIVP) (10256) [CPT-80076]  99214 - Ofc Vst, Est Level IV [ZOX-09604]    Medical Decision Making:   * review/order radiology tests    Time spent with patient:   Established: 25 min (54098)  More than 50% of this time was spent discussing:   * Treatment Plan

## 2007-07-20 NOTE — Unmapped (Signed)
Signed by Lyla Son on 07/20/2007 at 19:34:16    [facility]            [facility]    Healing ??? Teaching ??? Leading     [address], [address]  [place], [place] [address]  Ph:   [phone]  Fax: [phone]        July 10, 2007          Doyce Loose, M.D.    RE: Haley Ramos   DOB:  [birth year]    Dear Dr. Dutch Gray:     I had the pleasure of re-evaluating Haley Ramos at a follow-up visit in multiple sclerosis clinic. The patient has the diagnosis of relapsing-remitting multiple sclerosis and came in today to review the MRIs of the brain and C-spine.     The MRI of the brain revealed areas of increased T2 and FLAIR signal intensity in periventricular and callosal areas. There was no enhancement noted. The lesions appeared the same as on the previous MRI. 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 There was no enhancement.    The patient is doing well with no new symptoms. She has just some intermittent sensory paresthesias such as a burning sensation in the left arm. She also complained of anxiety and panic attacks. I have discussed that with the patient and she is planning to see her PCP regarding the possibilities for management of her anxiety.    Impression:    A 41 year old female with relapsing-remitting multiple sclerosis on immunomodulatory therapy with Rebif and doing well. The MRIs are stable.     Plan:  1. Obtain a CBC and liver function tests for safety monitoring.   2. The patient is to address the issue of anxiety and panic attacks with her primary care doctor.  3. Return to clinic in six months.     Thank you for allowing me to participate in the care of this pleasant patient.  If you would like a copy of my office note, please contact our Medical Records department at [phone].  Please feel free to contact me should you have any concerns or questions.    Sincerely,      Hulan Fess, MD

## 2008-01-04 NOTE — Unmapped (Signed)
Signed by Nestor Lewandowsky MD on 01/30/2008 at 15:50:16    Multiple Sclerosis Visit      History of Present Illness- Multiple Sclerosis:   Handedness: right  HPI: I had the pleasure of re-evaluating Haley Ramos at a follow-up visit in multiple sclerosis clinic. The patient has the diagnosis of multiple sclerosis and is currently on treatment with Rebif subcutaneous injections.     According to the patient, in the recent past she has had no disease attacks and there have been no new symptoms. She continues to have a hot sensation in the left arm and leg. Other than that, she has no significant issues. Her last MRI was done in November of last year and was stable.     x  x x      Past History  Past Medical History (reviewed - no changes required):  Migraines  Surgical History (reviewed - no changes required):  Tonsillectomy: 42 years old, No problems with anesthesia, No problems with healing, No problems with blood clots after surgery, Bunion surgery on left foot in 2004    Family History (reviewed - no changes required): Positive for Rheumatiod Arthritis and Migraines.  Social History (reviewed - no changes required): Employment Status: employed part-time,   Patient Lives at: home,   Seatbelt Use: 100 % of the time  Alcohol Use: none  Drug Use: none  Tobacco Usage:non-smoker    Review of Systems   General: Denies fevers, chills, sweats, anorexia, fatigue, malaise, weight loss, weight gain, drowsiness, insomnia.   Eyes: Denies blurring, diplopia, irritation, discharge, vision loss, eye pain, photophobia, redness.   Gastrointestinal: Denies nausea, vomiting, diarrhea, constipation, change in bowel habits, abdominal pain, melena, hematochezia, jaundice, spitting, encopresis, hematemesis, abdominal distention, edema, ascites.   Genitourinary: Denies vaginal discharge, incontinence, day time wetting, dysuria, hematuria, urinary frequency, amenorrhea, menorrhagia, abnormal vaginal bleeding, pelvic pain, nocturia,  flank pain, weak stream, precipitance, incomplete empty, retention, enuresis, perineal rash, tea-colored urine, polycystic ovaries, sexual dysfunction, kidney stones, urgency.   Neurologic: Complains of headache, paresthesias. Denies transient paralysis, weakness, seizures, syncope, tremors, vertigo, poor coordination, ataxia, word finding problems, concentration problems, memory problems, numbness, gait difficulties, falls, tingling, dizziness.       Intake-Neurology   Handedness: right    Vital Signs Weight: 110 pounds    BP #1: 98 / 62mm Hg Wt chg: -9    Allergies  No Known Allergies    Intake recorded by: Seymour Bars MA  Jan 04, 2008 9:11 AM      Diagnostic Testing:   MRI History MRI of the Brain with and without contrast on 06-24-2006.     Stable findings suggesting a demyelinating process such as multiple sclerosis.  Findings are stable compared to 08-02-05.        Physical Exam-Neurology   Appearance: well developed, well nourished and in no acute distress  Language/Speech: no aphasia and no dysarthria  Atten/concentration: awake and alert  Orientation: oriented x3  Memory: grossly intact  Fund of Knowledge: grossly intact  Ophthalmoscopic: no papilledema/good venous pulsations  Pupils: equal, round, reactive to light and accommodation  Visual Fields:   normal  CN 3,4,6 (EOM): extraocular movements intact, normal gaze alignment, and no nystagmus  Cranial Nerve 5 (Trigeminal): bilateral facial sensation normal  Cranial Nerve 7 (Facial): normal facial symmetry and strength  CN 8 (Auditory): proper hearing to finger rub bilaterally  Cranial Nerve 9 (Glossophar): soft palate elevates in the midline spontaneously  CN 11 (spinal access): full shoulder  shrug equally  Cranial Nerve 12 (Hypoglossal): tongue is midline on protrusion    General:   Head and Neck no lymphadenopathy, carotids 2+/no bruits, nl thyroid gland  Pulmonary: clear to auscultation    Muscle Strength   Muscle strength is 5/5 in all major muscle  groups.    Muscle Tone Examination   Muscle tone is normal in all muscle groups of the upper and lower extremities.      Coordination/ Station & Gait:   Coordination is normal to rapid alternating movements and finger to nose testing.  Station and gait are normal to regular, heel, toe and tandem walk. Romberg is (-).        Medications   New Prescriptions/Refills:  REBIF 44 MCG/0.5ML SOLN (INTERFERON BETA-1A) 1 injection SC TIW  #36 x 3, 01/04/2008, Seymour Bars MA    Assessment    A 42 year old female with relapsing-remitting multiple sclerosis, on Rebif and doing well. The patient's disease appears to be stable with no new attacks and no new symptoms. Her last MRI done in November of last year did not reveal any new lesions and no enhancement. To the contrary, it appeared very stable.     x        Plan    1. Continue the current regimen.  2. Continue treatment with Rebif.   3. Continue watching the patient's blood work for safety monitoring.   4. Return to clinic for a follow-up visit in six months.       x      Today's Orders   99214 - Ofc Vst, Est Level IV [CPT-99214]    Risks & Benefits:   * Risks, benefits and treatment options discussed with patient.    Time spent with patient:   Established: 25 min (81191)  More than 50% of this time was spent discussing:   * Management  * Treatment Plan      Prescriptions:  REBIF 44 MCG/0.5ML SOLN (INTERFERON BETA-1A) 1 injection SC TIW  #36 x 3   Entered by: Seymour Bars MA   Authorized by: Nestor Lewandowsky MD   Signed by: Seymour Bars MA on 01/04/2008   Method used: Print then Give to Patient   RxID: [id number]            ]

## 2008-01-30 NOTE — Unmapped (Signed)
Signed by Lyla Son on 01/30/2008 at 16:40:31    [facility]            [facility]    Healing ??? Teaching ??? Leading     [address]  [place], [place] [address]  Ph:   [phone]  Fax: [phone]    Jan 04, 2008          Doyce Loose, M.D.    RE: Haley Ramos   DOB:  Jun 19, 1966    Dear Dr. Dutch Gray:     I had the pleasure of re-evaluating Haley Ramos at a follow-up visit in multiple sclerosis clinic. The patient has the diagnosis of multiple sclerosis and is currently on treatment with Rebif subcutaneous injections.     According to the patient, in the recent past she has had no disease attacks and there have been no new symptoms. She continues to have a hot sensation in the left arm and leg. Other than that, she has no significant issues. Her last MRI was done in November of last year and was stable.    Impression:    A 42 year old female with relapsing-remitting multiple sclerosis, on Rebif and doing well. The patient's disease appears to be stable with no new attacks and no new symptoms. Her last MRI done in November of last year did not reveal any new lesions and no enhancement. To the contrary, it appeared very stable.      Plan:  1. Continue the current regimen.  2. Continue treatment with Rebif.   3. Continue watching the patient's blood work for safety monitoring.   4. Return to clinic for a follow-up visit in six months.      Thank you for allowing me to participate in the care of this pleasant patient.  If you would like a copy of my office note, please contact our Medical Records department at [phone].  Please feel free to contact me should you have any concerns or questions.    Sincerely,      Hulan Fess, MD    MM/md

## 2008-07-23 NOTE — Unmapped (Signed)
Signed by Seymour Bars MA on 07/23/2008 at 12:41:24    Clinical Lists Changes    Orders:  Added new Test order of MRI, Brain w/ and w/o gadolinium ([id number]) - Signed  Added new Test order of MRI, Spine, Cervical, w/ & w/o contrast ([id number]) - Signed      Patientscheduled on the 3T for Friday 07/26/08 at 10:45 am order faxed along with insurance info

## 2008-07-26 ENCOUNTER — Inpatient Hospital Stay

## 2008-07-26 NOTE — Unmapped (Signed)
Signed by   LinkLogic on 07/31/2008 at 15:03:52  Patient: Haley Ramos  Note: All result statuses are Final unless otherwise noted.    Tests: (1) MRI-C-SPINE W/WO CONTRAST [id number])    Order NotePricilla Handler Order Number: [id number]    Order Note:     *** VERIFIED ***  [facility]  Reason:  BRAIN & C-SPINE W/WO; HX OF MS  Dict.Staff: Eulah Pont [phone]  Dict.Res: Marval Regal [id number]  Verified By: Eulah Pont       Ver: 07/31/08   3:03 pm  Exams:  MRI-C-SPINE W/WO CONTRAST    MRI brain and cervical spine before and after administration of  11 mL Magnevist July 26, 2008 at 1154 hours    History: 42 year old female with multiple sclerosis presents  with left foot and arm pain.    Comparison: MRI brain and cervical spine June 19, 2007    MRI BRAIN:  Findings:  There is no restricted diffusion. There is moderate mainly  periventricular white matter demyelinating disease including  lesions within the left thalamus/posterior limb of the internal  capsule. Facilitated diffusion is demonstrated within many of  these lesions. There is no contrast enhancement. Numerous  lesions or subcortical and involve the fibers. There is a lesion  within the left brachium pontis. However, most of the lesions or  supratentorial. Multiple lesions involve the corpus callosum.  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 Vascular flow  voids appear patent.    Impression:  Moderate white matter disease mainly supratentorial and  perivenular in distribution consistent with the patient's  history of multiple sclerosis. There is no enhancement or  restricted diffusion to suggest active disease. There is no  significant  change from the prior study.    MRI CERVICAL SPINE:  Findings:  A high T2 signal intensity lesion is demonstrated at the right  posterolateral aspect of the spinal cord at the level of C2-C3.  This is not clearly seen on the comparison study. However the  lack of conspicuity on the prior study could be due to  differences in technique. An additional right posterolateral  lesion is demonstrated at C3-C4 and is similar to prior study.  There is no enhancement.    Craniocervical junction appears normal. Marrow signal appears  grossly normal.    Findings at specific levels as follows:    At C4-C5, there is a minimal diffuse disc bulge with a minimal  superimposed central protrusion.    At C5-C6, there is a mild to moderate left paracentral  subarticular disc protrusion with posterior annular tear. There  is mild asymmetric cord compression, left mild asymmetric canal  stenosis, left mild lateral recess stenosis and left mild  neuroforaminal narrowing. This has at least mildly progressed    No additional disc bulges are demonstrated.    Impression:    1. Likely stable spinal cord lesions consistent with the  patient's history of multiple sclerosis. There is no enhancement  to suggest active disease.  2. Disc protrusion at C5-6 with mild asymmetric cord compression  and at least slight interval progression.  **** end of result ****    Order  Note:   EMR Routing to: Nestor Lewandowsky., MD - ordering - [id number]  EMR Routing to: Trudee Grip, MD - referring - [phone]    Note: An exclamation mark (!) indicates a result that was not dispersed into   the flowsheet.  Document Creation Date: 07/31/2008 3:03 PM  _______________________________________________________________________    (1) Order result status: Final  Collection or observation date-time: 07/26/2008 12:31:26  Requested date-time: 07/26/2008 11:15:00  Receipt date-time:   Reported date-time: 07/31/2008 15:03:44  Referring Physician: Cindi Carbon NON-STAFF  Ordering  Physician: Hulan Fess [facility])  Specimen Source:   Source: QRS  Filler Order Number: [id number]  Lab site: [facility]

## 2008-07-26 NOTE — Unmapped (Signed)
Signed by   LinkLogic on 07/31/2008 at 15:03:51  Patient: Haley Ramos  Note: All result statuses are Final unless otherwise noted.    Tests: (1) MRI-HEAD W/WO CONTRAST [phone])    Order Note: Pricilla Handler Order Number: [id number]    Order Note:     *** VERIFIED ***  [facility]  Reason:  BRAIN & C-SPINE W/WO; HX OF MS  Dict.Staff: Eulah Pont [phone]  Dict.Res: Marval Regal [id number]  Verified By: Eulah Pont       Ver: 07/31/08   3:03 pm  Exams:  MRI-C-SPINE W/WO CONTRAST    MRI brain and cervical spine before and after administration of  11 mL Magnevist July 26, 2008 at 1154 hours    History: 42 year old female with multiple sclerosis presents  with left foot and arm pain.    Comparison: MRI brain and cervical spine June 19, 2007    MRI BRAIN:  Findings:  There is no restricted diffusion. There is moderate mainly  periventricular white matter demyelinating disease including  lesions within the left thalamus/posterior limb of the internal  capsule. Facilitated diffusion is demonstrated within many of  these lesions. There is no contrast enhancement. Numerous  lesions or subcortical and involve the fibers. There is a lesion  within the left brachium pontis. However, most of the lesions or  supratentorial. Multiple lesions involve the corpus callosum.  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 Vascular flow  voids appear patent.    Impression:  Moderate white matter disease mainly supratentorial and  perivenular in distribution consistent with the patient's  history of multiple sclerosis. There is no enhancement or  restricted diffusion to suggest active disease. There is no  significant  change from the prior study.    MRI CERVICAL SPINE:  Findings:  A high T2 signal intensity lesion is demonstrated at the right  posterolateral aspect of the spinal cord at the level of C2-C3.  This is not clearly seen on the comparison study. However the  lack of conspicuity on the prior study could be due to  differences in technique. An additional right posterolateral  lesion is demonstrated at C3-C4 and is similar to prior study.  There is no enhancement.    Craniocervical junction appears normal. Marrow signal appears  grossly normal.    Findings at specific levels as follows:    At C4-C5, there is a minimal diffuse disc bulge with a minimal  superimposed central protrusion.    At C5-C6, there is a mild to moderate left paracentral  subarticular disc protrusion with posterior annular tear. There  is mild asymmetric cord compression, left mild asymmetric canal  stenosis, left mild lateral recess stenosis and left mild  neuroforaminal narrowing. This has at least mildly progressed    No additional disc bulges are demonstrated.    Impression:    1. Likely stable spinal cord lesions consistent with the  patient's history of multiple sclerosis. There is no enhancement  to suggest active disease.  2. Disc protrusion at C5-6 with mild asymmetric cord compression  and at least slight interval progression.  **** end of result ****    Order  Note:   EMR Routing to: Nestor Lewandowsky., MD - ordering - [id number]  EMR Routing to: Trudee Grip, MD - referring - [phone]    Note: An exclamation mark (!) indicates a result that was not dispersed into   the flowsheet.  Document Creation Date: 07/31/2008 3:03 PM  _______________________________________________________________________    (1) Order result status: Final  Collection or observation date-time: 07/26/2008 12:31:37  Requested date-time: 07/26/2008 11:15:00  Receipt date-time:   Reported date-time: 07/31/2008 15:03:44  Referring Physician: Cindi Carbon NON-STAFF  Ordering  Physician: Hulan Fess [facility])  Specimen Source:   Source: QRS  Filler Order Number: [id number]  Lab site: [facility]

## 2008-08-01 NOTE — Unmapped (Signed)
Signed by Nestor Lewandowsky MD on 09/05/2008 at 17:04:28    Multiple Sclerosis Visit      History of Present Illness- Multiple Sclerosis:   Handedness: right  HPI: I re-evaluated Haley Ramos at a follow-up visit in multiple sclerosis clinic on 08/01/08. The patient is a 42 year old female with relapsing-remitting multiple sclerosis. She is currently on treatment with Rebif immunomodulatory therapy with subcutaneous injections three times a week.     According to the patient, she continues to have left footdrop when she walks for longer distances. She also feels numbness in her left arm. She gets very fatigued due to her MS.     Today we reviewed the MRIs of the brain and cervical spine. The MRI of the brain revealed white matter disease consistent with demyelination. There is no enhancement and, according to the radiologist's report, there is no change from the previous MRI. I showed the films to the patient. We also looked at the MRI of the cervical spine which revealed spinal cord lesions with no enhancement, also stable with no change from the previous scans.      x  x x            Intake-Neurology   Handedness: right    Vital Signs Weight: 114 pounds    BP #1: 108 / 62mm Hg Wt chg: 4    Allergies  No Known Allergies    Intake recorded by: Seymour Bars MA  August 01, 2008 8:46 AM      Diagnostic Testing:   MRI History MRI of the Brain with and without contrast on 06-24-2006.     Stable findings suggesting a demyelinating process such as multiple sclerosis.  Findings are stable compared to 08-02-05.          Assessment    A 42 year old female with multiple sclerosis, currently on treatment with Rebif. The MRIs which I reviewed today revealed stable disease. The patient is doing well. However, she continues to complain of significant fatigue due to her demyelinating disease. She also has a left footdrop when she walks for a long period of time and numbness of her left arm.    x        Plan    Return to clinic  to follow up with Haley Libra, CNP, in six months.    x      Today's Orders   99214 - Ofc Vst, Est Level IV [id number]    Medical Decision Making:   * review/order radiology tests  * reviewed films    Risks & Benefits:   * Risks, benefits and treatment options discussed with patient.    Time spent with patient:   Established:    25 min  More than 50% of this time was spent discussing:   * Management  * Treatment Plan  * Diagnostic Results as indicated above

## 2008-09-06 NOTE — Unmapped (Signed)
Signed by Lyla Son on 09/06/2008 at 09:00:39    [facility]            [facility]    Healing ??? Teaching ??? Leading     [address], [address]  [place], [place] [address]  Ph:   [phone]  Fax: [phone]    August 01, 2008          Doyce Loose, M.D.    RE: Haley Ramos   DOB:  Jan 27, 1966    Dear Dr. Dutch Gray:     I re-evaluated Haley Ramos at a follow-up visit in multiple sclerosis clinic on 08/01/08. The patient is a 43 year old female with relapsing-remitting multiple sclerosis. She is currently on treatment with Rebif immunomodulatory therapy with subcutaneous injections three times a week.     According to the patient, she continues to have left footdrop when she walks for longer distances. She also feels numbness in her left arm. She gets very fatigued due to her MS.     Today we reviewed the MRIs of the brain and cervical spine. The MRI of the brain revealed white matter disease consistent with demyelination. There is no enhancement and, according to the radiologist's report, there is no change from the previous MRI. I showed the films to the patient. We also looked at the MRI of the cervical spine which revealed spinal cord lesions with no enhancement, also stable with no change from the previous scans.     Assessment:  A 43 year old female with multiple sclerosis, currently on treatment with Rebif. The MRIs which I reviewed today revealed stable disease. The patient is doing well. However, she continues to complain of significant fatigue due to her demyelinating disease. She also has a left footdrop when she walks for a long period of time and numbness of her left arm.      Plan:  Return to clinic to follow up with Letitia Libra, CNP, in six months.      Thank you for allowing me to participate in the care of this pleasant patient.  If you would like a copy of my office note, please contact our Medical Records department at [phone].  Please feel free to contact me  should you have any concerns or questions.    Sincerely,      Hulan Fess, MD    MM/md

## 2008-09-10 NOTE — Unmapped (Signed)
Signed by Seymour Bars MA on 09/10/2008 at 12:13:44    PHONE NOTE - Call from Pharmacy      Pharmacy Name: [facility]  Caller: [facility]  Pharmacy Phone Number: [phone] OPT 4  Call for: Dr Grayland Jack    Reason for Call: , Needs new rx for Rebif. Please call pharmacy @ [phone] OPT 4    Initial call taken by: Laney Pastor,  September 10, 2008 9:57 AM      Prescriptions:  REBIF 44 MCG/0.5ML SOLN (INTERFERON BETA-1A) 1 injection SC TIW  #36 x 3   Entered by: Seymour Bars MA   Authorized by: Nestor Lewandowsky MD   Signed by: Seymour Bars MA on 09/10/2008   Method used: Historical   RxID: [id number]

## 2008-09-19 NOTE — Unmapped (Signed)
Signed by Seymour Bars MA on 09/19/2008 at 14:04:50    PHONE NOTE - Patient Call    Call back at Home Phone: [phone]  Caller: patient  Department: Neurology  Call for: Dr Grayland Jack    Reason for Call: Needs Kristi to fax in supply of Rebif to new insurance [employer] @ fax [phone]. If y ou have any questions call her @ [phone]      Initial call taken by: Laney Pastor,  September 19, 2008 1:37 PM      FOLLOW UP  Faxed and patient notified  Follow-up by: Seymour Bars MA,  September 19, 2008 2:04 PM    Prescriptions:  REBIF 44 MCG/0.5ML SOLN (INTERFERON BETA-1A) 1 injection SC TIW  #36 x 3   Entered by: Seymour Bars MA   Authorized by: Nestor Lewandowsky MD   Signed by: Seymour Bars MA on 09/19/2008   Method used: Printed then faxed to ...        RxID: [id number]

## 2009-03-04 NOTE — Unmapped (Signed)
Signed by Buel Ream CNP on 03/04/2009 at 14:00:19    PHONE NOTE  Call back at Home Phone: [phone]  Caller: patient    Reason for Call: pt has a question about rebiff needs call back       Initial call taken by: Salomon Fick MA,  March 04, 2009 11:48 AM      FOLLOW UP  Spoke with patient states that her Rebif layed out for a day and was warm when she gave herself the injection,  now her leg is sore, no fever or anyother symptoms advised patient to contact the Rebif nurse  Follow-up by:  Seymour Bars MA,  March 04, 2009 12:23 PM    FOLLOW UP  OK - rebif can sit out for 30day - calling Rebif nurse is appropriate  Follow-up by:  Buel Ream CNP,  March 04, 2009 2:00 PM

## 2009-08-11 NOTE — Unmapped (Signed)
Signed by Yves Dill MA on 08/11/2009 at 10:05:18    PHONE NOTE    PHONE NOTE  Pharmacy Name: [facility]  Caller: [facility]  Pharmacy Phone Number: [phone]  Call for: Haley Ramos    Reason for Call: , They are calling to make sure that they have the correct directions. Please call them back.   Use reference # [id number]    Initial call taken by: Rick Duff,  August 11, 2009 9:27 AM      FOLLOW UP  I have confirmed the patient's prescription for Rebif.   Follow-up by:  Yves Dill MA,  August 11, 2009 10:05 AM

## 2009-08-19 NOTE — Unmapped (Signed)
Signed by Seymour Bars MA on 08/19/2009 at 16:09:47    PHONE NOTE  Call back at Home Phone: [phone]  Caller: patient  Department: Neurology  Call for: [facility]    Reason for Call: schedule yearly MRI and visit w/Dr M      Initial call taken by: Young Berry,  August 19, 2009 3:28 PM      New Orders:  MRI, Head w/ & w/o contrast [CPT-70553]  MRI, Spine, Cervical w/ & w/o contrast [CPT-72156]

## 2009-08-20 NOTE — Unmapped (Signed)
Signed by Seymour Bars MA on 08/20/2009 at 15:07:50    PHONE NOTE  Call back at Home Phone: [phone]  Caller: patient  Department: Neurology  Call for: [facility]    Reason for Call: lost # for MRI can lv on vm      Initial call taken by: Young Berry,  August 20, 2009 2:51 PM      FOLLOW UP  Number given  Follow-up by:  Seymour Bars MA,  August 20, 2009 3:07 PM

## 2009-09-08 ENCOUNTER — Inpatient Hospital Stay

## 2009-09-08 NOTE — Unmapped (Signed)
Signed by   LinkLogic on 09/08/2009 at 11:08:16  Patient: Haley Ramos  Note: All result statuses are Final unless otherwise noted.    Tests: (1) MRI-HEAD W/WO CONTRAST [phone])    Order Note: [facility] Accession Number: [id number]    Order Note:                               [facility]     Patient: Haley Ramos   DOB:     March 28, 1966   MRN:     [id number]   FIN:       [id number]   Accn#:   [id number]                               Magnetic Resonance Imaging     Exam                       Exam Date/Time       Ordering Physician   MRI-C-SPINE W/WO CONTRAST  09/08/2009 10:31 EST Johnetta Sloniker J.   MRI-HEAD W/WO CONTRAST     09/08/2009 10:31 EST Mlissa Tamayo J.     Reason for Exam   (MRI-C-SPINE W/WO CONTRAST)  MULTIPLE SCLEROSIS   (MRI-HEAD W/WO CONTRAST)  MULTIPLE SCLEROSIS     Report   MRI brain and cervical spine without and with contrast 09/08/2009.     Clinical Indication: Multiple sclerosis.     Technique:     MS protocol MR examination of the brain and cervical spine performed   without and with intravenous administration 10 mL Magnevist. DTI findings   can be discussed separately.     Comparison: 07/26/2008.       MRI brain findings:     The ventricles remain normal in size and position. There is no evidence of   intracranial mass effect.     Scattered ovoid generally periventricular white matter signal alterations   are again identified correlating with the history of multiple sclerosis. No   new or progressive lesions are confirmed. No abnormal enhancing lesions are   identified.     There is an area of near CSF signal white matter cavitation in the right   parietal lobe which is unchanged. Lesions involving the left corpus   callosum splenium appear similar. A lesion involving the left brachium   pontis appear similar. There is probable hazy signal abnormality right   aspect of the cervical cord at C1 which is incompletely included.     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     Sagittal images show normal appearance of the pituitary gland, optic   chiasm, and cerebellar tonsils.     The orbits, paranasal sinuses, skull base and mastoid regions as well as   intracranial flow-voids are otherwise unremarkable.     No areas of restricted diffusion are identified.     MRI brain impression:     Stable appearance of white matter signal alterations correlating with the   history of multiple sclerosis. No enhancing lesions or restricted diffusion   confirmed.       MRI cervical spine findings:     Sagittal images show normal AP alignment of the cervical spine with mild   multilevel disc desiccation and mild disc height loss.     Patchy cord signal alterations are again identified correlating with the  history of multiple sclerosis.     There is subtle hazy involvement right aspect of the C1 level which is not   included on the prior axial images but is likely present on series 19 image   9 and similar to the current series 17 image 7.     There is patchy heterogeneous cord signal alteration right aspect of the   C2-C3 level which appears similar. There is probable subtle involvement   right aspect of the cervical cord at C5-C6 although not well documented due   to motion artifacts on the current and previous examination. No substantial   change is demonstrated.     At C2-C3 level, there is a tiny partial annular fissure in the left   preforaminal aspect of the disc which appears new. No cord compression has   developed. At C3-C4, no significant abnormality is identified. At C4-C5,   there is a mild diffuse disc bulge causing mild central stenosis with AP   canal dimension of 9 mm.     At the C5-C6 level, there is a broad-based left central disc protrusion   causing borderline to minimal left lateral cord compression and mild canal   narrowing to an AP dimension of 8 mm. This disc protrusion appears similar   to slightly decreased.     At the C6-C7 level, and C7-T1, no compressive  abnormalities are identified.   There is no evidence of neck mass or adenopathy on this examination.     Following gadolinium administration, no enhancing lesions are identified.     MRI cervical spine impression:     Similar appearance of cord lesions correlating with history multiple   sclerosis. No enhancement identified.     Multilevel cervical degenerative disc disease with similar to slightly       decreased disc protrusion at C5-C6. There remains subtle left lateral cord   compression.   ********** VERIFIED REPORT **********     Dictated: 09/08/2009 11:06 am     UDSTUEN MD, York Cerise   Signed (Electronic Signature):  Barton Fanny MD, York Cerise             09/08/09   11:09     Technologist: RCC,TLD    Order Note:   EMR Routing to: Nestor Lewandowsky - ordering - [id number]    Note: An exclamation mark (!) indicates a result that was not dispersed into   the flowsheet.  Document Creation Date: 09/08/2009 11:08 AM  _______________________________________________________________________    (1) Order result status: Final  Collection or observation date-time: 09/08/2009 10:31:29  Requested date-time: 09/08/2009 09:00:00  Receipt date-time:   Reported date-time: 09/08/2009 11:09:49  Referring Physician: Hulan Fess  Ordering Physician: Hulan Fess [facility])  Specimen Source: RAD&Rad Type  Source: QRS  Filler Order Number: [id number]  Lab site: [facility]

## 2009-09-08 NOTE — Unmapped (Signed)
Signed by Seymour Bars MA on 09/08/2009 at 12:11:55    PHONE NOTE  Call back at Home Phone: [phone]  Caller: patient  Department: Neurology  Call for: [facility]    Reason for Call: pt needs an appt earlier than Mar - f/u on MRI      Initial call taken by: Young Berry,  September 08, 2009 11:44 AM      FOLLOW UP  Left message can schedule with [facility]  Follow-up by:  Seymour Bars MA,  September 08, 2009 12:11 PM

## 2009-09-08 NOTE — Unmapped (Signed)
Signed by   LinkLogic on 09/08/2009 at 11:08:14  Patient: Haley Ramos  Note: All result statuses are Final unless otherwise noted.    Tests: (1) MRI-C-SPINE W/WO CONTRAST [phone])    Order Note: [facility] Accession Number: [id number]    Order Note:                               [facility]     Patient: Haley Ramos, Haley Ramos   DOB:     1965/08/28   MRN:     [id number]   FIN:       [id number]   Accn#:   [id number]                               Magnetic Resonance Imaging     Exam                       Exam Date/Time       Ordering Physician   MRI-C-SPINE W/WO CONTRAST  09/08/2009 10:31 EST Saamir Armstrong J.   MRI-HEAD W/WO CONTRAST     09/08/2009 10:31 EST Cheryl Chay J.     Reason for Exam   (MRI-C-SPINE W/WO CONTRAST)  MULTIPLE SCLEROSIS   (MRI-HEAD W/WO CONTRAST)  MULTIPLE SCLEROSIS     Report   MRI brain and cervical spine without and with contrast 09/08/2009.     Clinical Indication: Multiple sclerosis.     Technique:     MS protocol MR examination of the brain and cervical spine performed   without and with intravenous administration 10 mL Magnevist. DTI findings   can be discussed separately.     Comparison: 07/26/2008.       MRI brain findings:     The ventricles remain normal in size and position. There is no evidence of   intracranial mass effect.     Scattered ovoid generally periventricular white matter signal alterations   are again identified correlating with the history of multiple sclerosis. No   new or progressive lesions are confirmed. No abnormal enhancing lesions are   identified.     There is an area of near CSF signal white matter cavitation in the right   parietal lobe which is unchanged. Lesions involving the left corpus   callosum splenium appear similar. A lesion involving the left brachium   pontis appear similar. There is probable hazy signal abnormality right   aspect of the cervical cord at C1 which is incompletely included.     Mildly complex cystic pineal gland is  again noted.     Sagittal images show normal appearance of the pituitary gland, optic   chiasm, and cerebellar tonsils.     The orbits, paranasal sinuses, skull base and mastoid regions as well as   intracranial flow-voids are otherwise unremarkable.     No areas of restricted diffusion are identified.     MRI brain impression:     Stable appearance of white matter signal alterations correlating with the   history of multiple sclerosis. No enhancing lesions or restricted diffusion   confirmed.       MRI cervical spine findings:     Sagittal images show normal AP alignment of the cervical spine with mild   multilevel disc desiccation and mild disc height loss.     Patchy cord signal alterations are again identified correlating with the  history of multiple sclerosis.     There is subtle hazy involvement right aspect of the C1 level which is not   included on the prior axial images but is likely present on series 19 image   9 and similar to the current series 17 image 7.     There is patchy heterogeneous cord signal alteration right aspect of the   C2-C3 level which appears similar. There is probable subtle involvement   right aspect of the cervical cord at C5-C6 although not well documented due   to motion artifacts on the current and previous examination. No substantial   change is demonstrated.     At C2-C3 level, there is a tiny partial annular fissure in the left   preforaminal aspect of the disc which appears new. No cord compression has   developed. At C3-C4, no significant abnormality is identified. At C4-C5,   there is a mild diffuse disc bulge causing mild central stenosis with AP   canal dimension of 9 mm.     At the C5-C6 level, there is a broad-based left central disc protrusion   causing borderline to minimal left lateral cord compression and mild canal   narrowing to an AP dimension of 8 mm. This disc protrusion appears similar   to slightly decreased.     At the C6-C7 level, and C7-T1, no compressive  abnormalities are identified.   There is no evidence of neck mass or adenopathy on this examination.     Following gadolinium administration, no enhancing lesions are identified.     MRI cervical spine impression:     Similar appearance of cord lesions correlating with history multiple   sclerosis. No enhancement identified.     Multilevel cervical degenerative disc disease with similar to slightly       decreased disc protrusion at C5-C6. There remains subtle left lateral cord   compression.   ********** VERIFIED REPORT **********     Dictated: 09/08/2009 11:06 am     UDSTUEN MD, York Cerise   Signed (Electronic Signature):  Barton Fanny MD, York Cerise             09/08/09   11:09     Technologist: RCC,TLD    Order Note:   EMR Routing to: Nestor Lewandowsky - ordering - [id number]    Note: An exclamation mark (!) indicates a result that was not dispersed into   the flowsheet.  Document Creation Date: 09/08/2009 11:08 AM  _______________________________________________________________________    (1) Order result status: Final  Collection or observation date-time: 09/08/2009 10:31:29  Requested date-time: 09/08/2009 09:00:00  Receipt date-time:   Reported date-time: 09/08/2009 11:09:49  Referring Physician: Hulan Fess  Ordering Physician: Hulan Fess [facility])  Specimen Source: RAD&Rad Type  Source: QRS  Filler Order Number: [id number]  Lab site: [facility]

## 2009-09-18 NOTE — Unmapped (Signed)
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 Denies flu symptoms  Numbness left arm improved,  Continues to have left foot weakness on rare occassion when more fatigued  Continues to train for marathons - has run 3 marathons  Labs done inDec at PCP - CBC and Liver Profile noted by pt as normal -    PAST HISTORY  Past Medical History (reviewed - no changes required):  Migraines, Multiple Sclerosis  Social History (reviewed - no changes required): Employment Status: employed part-time,   Patient Lives at: home,   Seatbelt Use: 100 % of the time  Alcohol Use: none  Drug Use: none  Tobacco Usage:non-smoker    Review of Systems   General: Denies fevers, chills, fatigue, insomnia.   Eyes: Denies blurring, vision loss, eye pain.   Ears/Nose/Throat: Denies any specific issues at this time.   Cardiovascular: Denies any specific issues at this time.   Respiratory: Denies any specific issues at this time.   Gastrointestinal: Denies any specific issues at this time.   Genitourinary: Denies dysuria, urinary frequency, incomplete empty.   Musculoskeletal: Denies muscle cramps, muscle weakness, muscle pains, stiffness.   Skin: Denies any specific issues at this time.   Neurologic: Denies headache, paresthesias, word finding problems, concentration problems, memory problems, gait difficulties, falls.   Psychiatric: Denies any specific issues at this time.   Endocrine: Denies any specific issues at this time.   Heme/Lymphatic: Denies any specific issues at this time.   Allergic/Immunologic: Denies any specific issues at this time.            Intake-Neurology   Chief Complaint: f/u MS  Handedness: right    Vital Signs Weight: 121 pounds    BP #1: 98 / 62mm Hg Wt chg: 7    Allergies  No Known Allergies    Intake recorded by: Seymour Bars MA  September 18, 2009 8:45 AM        Diagnostic Testing:   MRI History MRI of the Brain with and without contrast on 06-24-2006.     Stable findings suggesting a demyelinating process such as multiple sclerosis.  Findings are stable compared to 08-02-05.        Physical Exam-Neurology   Appearance: well developed, well nourished and in no acute distress  Language/Speech: no aphasia and no dysarthria  Atten/concentration: awake and alert  Orientation: oriented x3  Memory: grossly intact  Fund of Knowledge: grossly intact  Ophthalmoscopic: no papilledema/good venous pulsations  Pupils: equal, round, reactive to light and accommodation  Visual Fields:   normal  CN 3,4,6 (EOM): extraocular movements intact, normal gaze alignment, and no nystagmus  Cranial Nerve 5 (Trigeminal): bilateral facial sensation normal  Cranial Nerve 7 (Facial): normal facial symmetry and strength  CN 8 (Auditory): proper hearing to finger rub bilaterally  Cranial Nerve 9 (Glossophar): soft palate elevates in the midline spontaneously  CN 11 (spinal access): full shoulder shrug equally  Cranial Nerve 12 (Hypoglossal): tongue is midline on protrusion  General:   Head and Neck no lymphadenopathy, carotids 2+/no bruits, nl thyroid gland  Pulmonary: clear to auscultation  CVS: RRR, nl S1 S2, no S3 S4, no M  Abdomen: soft, no tenderness, no organomegaly  Extremities: no edema, cynosis, clubbing, pulses 2+    Muscle Strength     Right Upper Extremity   Shoulder abductor (right): 5/5  Elbow flexor (right): 5/5  Elbow extensor (right): 5/5  Wrist Flexors (right): 5/5  Wrist Extensors (right): 5/5  Finger Flexors (right): 5/5  Interosseous (right): 5/5  APB (right): 5/5    Left Upper Extremity   Shoulder abductor (left): 5/5  Elbow flexor (left):  5/5  Elbow extensor (left): 5/5  Wrist Flexors (left): 5/5  Wrist Extensors (left): 5/5  Finger Flexors (left): 5/5  Interosseous (left): 5/5  APB (left): 5/5    Right Lower Extremity   Hip Flexion (right): 5/5  Hip Adduction (right): 5/5  Hip Abduction (right): 5/5  Knee extension (right): 5/5  Knee Flexion (right): 5/5  Ankle Dorsi Flexor (right): 5/5  Pedal Flexor (right): 5/5  Ankle Inverter (right): 5/5  Ankle Everter (right): 5/5    Left Lower Extremity   Hip Flexion (left): 5/5  Hip Adduction(left): 5/5  Hip Abduction (left): 5/5  Knee extension (left): 5/5  Knee Flexion (left): 5/5  Ankle Dorsi Flexor (left): 5/5  Pedal Flexors (left): 5/5  Ankle Inverter (left): 5/5  Ankle Everter (left): 5/5    Left Upper Extremity   Left Upper Extremity: normal    Left Lower Extremity   Left Lower Extremity: normal    Reflexes     Right   Biceps (right): normal  Brachio Radialis (right): normal  Patellar (right): normal  Ankle Jerk (right): normal  Babinski (right): absent    Left   Biceps (left): normal  Brachio Radialis (left): normal  Patellar (left): normal  Ankle Jerk (left): normal  Babinski (left): mute    Sensory Examination     Right Upper Extremity   Light Touch- Right Upper Extremity normal   Pinprick- Right Upper Extremity: normal   Temperature- Right Upper Extremity: normal   Vibration- Right Upper Extremity: normal   Proprioception- Right Upper Extremity: normal     Left Upper Extremity   Light Touch-Left Upper Extremity normal   Pinprick- Left Upper Extremity: normal   Temperature- Left Upper Extremity: normal   Vibration- Left Upper Extremity: normal   Proprioception- Left Upper Extremity: normal     Right Lower Extremity   Light Touch- Right Lower Extremity normal   Pinprick- Right Left Extremity: normal   Temperature- Right Lower Extremity: normal   Vibration- Right Lower Extremity: normal   Proprioception- Right Lower Extremity: normal     Left Lower Extremity   Light Touch- Left Lower Extremity normal    Pinprick- Left Lower Extremity: normal   Temperature- Left Lower Extremity: normal   Vibration- Left Lower Extremity: normal   Proprioception- Left Lower Extremity: normal     Coordination/ Station & Gait:   Rapid alternating movements (right): normal  Finger to nose (right):normal  Heel to shin (right): normal  Finger Tapping (right): normal  Rapid alternating movements (left):normal  Finger to nose (left):normal  Heel to shin (left): normal  Finger Tapping (left): normal  Romberg: normal  Station/Gait: normal      Assessment    44 yo WF with 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    MRI showed not changes in Brain and C-Spine - Stable WMD  and not enhancments     Plan    Contiinue with Rebif - with annual lab monitoring  Healthy lifestyle choices   F/U on annual basis   Today's Orders   99214 - Ofc Vst, Est Level IV [[id number]]    Medical Decision Making:   * independent review of data- e.g. image- specimen  * Permanent chart problem/surgery list reviewed  * Permanent chart social/family history reviewed    Patient Education:   Patient advised exercise >20 minutes 3xs per week .    Is visit primarily counseling/coordination? yes  Risk of complications: moderate    Time spent with patient:   Established:    25 min  More than 50% of this time was spent discussing:   * Management  * Treatment Plan  * Diagnostic Results as indicated above                    ]

## 2009-09-21 NOTE — Unmapped (Signed)
Signed by Lyla Son on 09/21/2009 at [facility]                        [facility]         Neurology         [address]         [place], [place] [address]         p [phone] f [phone]         [email or web address]    September 18, 2009      Doyce Loose, M.D.    RE: Haley Ramos  DOB:   Nov 22, 1965    Dear Dr. Dutch Gray:    I had the pleasure of seeing your patient, Haley Ramos, in consultation in the [facility] on 09/18/2009.     History of Present Illness:  44 year old white female with relapsing-remitting multiple sclerosis, on Rebif, here for MRI review.  Injection sites - does not use autoinjector - gives shots in abdomen and legs, knots, soreness goes away in few days. Denies flu symptoms. Numbness left arm improved. Continues to have left foot weakness on rare occasions when more fatigued. Continues to train for marathons - has run 3 marathons. Labs done in December at PCP - CBC and liver profile noted by pt as normal.     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    MRIs showed no changes in brain and C-Spine - Stable white matter disease and no enhancement.      Plan:  Continue with Rebif - with annual lab monitoring.  Healthy lifestyle choices.  F/U on annual basis.    Thank you for allowing me to participate in the care of this pleasant patient.  If you would like a copy of my office note, please contact our Medical Records department at [phone].  Please feel free to contact me should you have any concerns or questions.    Sincerely,    Letitia Libra, CNP

## 2010-09-25 NOTE — Unmapped (Signed)
Signed by Seymour Bars MA on 09/25/2010 at 13:56:16    PHONE NOTE  Call back at Home Phone: [phone]  Caller: patient  Department: Neurology  Call for: [facility]    Reason for Call: pt asking if she can schedule an MRI on the same day prior to her appt scheduled here on 3/15 at 11:00      Initial call taken by: Young Berry,  September 25, 2010 1:43 PM      FOLLOW UP  Patient advised will not have be able to do appt and MRI at the same day.  Would not have results read bt radiloigist  Follow-up by:  Seymour Bars MA,  September 25, 2010 1:56 PM

## 2010-10-13 NOTE — Unmapped (Signed)
Signed by Generic  U[facility] Provider on 10/13/2010 at 00:00:00  Medical Records Release      Imported By: Betsey Amen 11/27/2010 06:35:40    _____________________________________________________________________    External Attachment:    Please see [facility] EMR for this document.

## 2011-09-27 NOTE — Unmapped (Signed)
Discuss MRI and blood work needed prior to next appt - pt last seen Feb '11 w/[facility]

## 2011-09-28 NOTE — Unmapped (Signed)
Pt will need MRI head and C spine w/wo contrast. She willl need a CBC with diff and a Hepatic panel. Orders will be placed in the chart and printed.

## 2011-11-08 ENCOUNTER — Encounter

## 2011-11-15 ENCOUNTER — Encounter

## 2011-11-15 ENCOUNTER — Inpatient Hospital Stay: Admit: 2011-11-15 | Payer: PRIVATE HEALTH INSURANCE | Attending: Family

## 2011-11-15 DIAGNOSIS — G35 Multiple sclerosis: Secondary | ICD-10-CM

## 2011-11-15 MED ORDER — GADAVIST (gadobutrol) Soln 0.1 mL/kg
1 | Freq: Once | INTRAVENOUS | Status: AC | PRN
Start: 2011-11-15 — End: 2011-11-15
  Administered 2011-11-15: 18:00:00 via INTRAVENOUS

## 2011-11-22 ENCOUNTER — Ambulatory Visit: Admit: 2011-11-22 | Discharge: 2011-11-22 | Payer: PRIVATE HEALTH INSURANCE | Attending: Family

## 2011-11-22 DIAGNOSIS — G35 Multiple sclerosis: Secondary | ICD-10-CM

## 2011-11-22 NOTE — Unmapped (Signed)
Subjective:      Patient ID: Haley Ramos is a 46 y.o. female.    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 She has been on Rebif since 2007. She was diagnosed in 1998. 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 She reports numbness left arm improved. She has a history of bunion surgery on her left foot that left her with left great toe numbness and ball of her foot numb. She reports having blind spots occur in her vision when she was reading last week. She is very concerned that her MS has progressed.     11-2011 Labs: Hepatic panel and CBC pending.     11-2011 MRI brain, C and T spines w/wo contrast: stable condition with no enhancing lesions.                 Histories:     She has no past medical history on file.    She has no past surgical history on file.    Her family history is not on file.    She reports that she has never smoked. She does not have any smokeless tobacco history on file.      Review of Systems    Allergies:   Review of patient's allergies indicates no known allergies.    Medications:     Outpatient Encounter Prescriptions as of 11/22/2011   Medication Sig Dispense Refill   ??? interferon beta-1a (REBIF) 44 mcg/0.5 mL injection Inject 44 mcg subcutaneously.            Objective:       Blood pressure 112/70, height 5' 2 (1.575 m), weight 119 lb (53.978 kg).    Neurologic Exam     Mental Status   Oriented to person, place, and time.     Oriented to person.   Oriented to place.   Oriented to time.   Level of consciousness: alert  Knowledge: good.     Cranial Nerves   Cranial nerves II through XII intact.     Motor Exam   Muscle bulk: normal  Overall muscle tone: normal  Right arm tone: normal  Left arm tone: normal  Right arm pronator drift: absent  Left arm pronator drift: absent  Right leg tone: normal  Left leg tone: normal     Strength   Strength 5/5 throughout.     Sensory Exam      Light touch normal.     Gait, Coordination, and Reflexes      Gait  Gait: normal     Coordination   Romberg: negative  Finger to nose coordination: normal  Tandem walking coordination: normal     Tremor   Resting tremor: absent  Intention tremor: absent  Action tremor: absent      Physical Exam   Constitutional: She is oriented to person, place, and time.   Neurological: She is oriented to person, place, and time. She has normal strength. She has a normal Finger-Nose-Finger Test, a normal Romberg Test and a normal Tandem Gait Test. Gait normal.       Prior Diagnostic Testing:   11-2011 Labs: Hepatic panel and CBC pending.     11-2011 MRI brain, C and T spines w/wo contrast: stable condition with no enhancing lesions.            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 She has been on Rebif since 2007. She was diagnosed in 1998. 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 She reports numbness left arm improved. She has a history of bunion surgery on her left foot that left her with left great toe numbness and ball of her foot numb.    11-2011 Labs: Hepatic panel and CBC pending.     11-2011 MRI brain, C and T spines w/wo contrast: stable condition with no enhancing lesions.     Oral medications Aubagio, Gilenya and Tecfidera benefits and risk of these medications were discussed and reviewed with the patient. She will be provided educational material on all three medications and she will call us with her decision. Tests will be ordered based on medication chosen.       Plan:   1. Pt to call with decision on oral DMT.  2. Tests will be ordered at that time.   3. RTC per protocol.            Medical Decision Making:  review/order clinical lab tests, review/order radiology tests, Permanent chart problem/surgery list reviewed, Permanent chart chronic med/allergy list reviewed, Permanent chart social/family history  reviewed and reviewed films    Patient Education:  Patient advised: adhere to medication regimen, exercise > 20 minutes 3xs per week, on low fat/low cholesterol diet and practice good sleep hygiene  Verbal information and/or pamphlet/literature given to patient: Yes    Risks & Benefits:  Risks, benefits and treatment options discussed with patient.    Is visit primarily counseling/coordination? Yes  Risk of complications: high    Time Spent with Patient:  Established: 40 min (78469)      More than 50% of this time was spent discussing:  Management and Treatment Plan

## 2011-11-23 NOTE — Unmapped (Signed)
Pt is asking for a call back.

## 2011-11-23 NOTE — Unmapped (Signed)
Patient decided to go with the BG12 oral medication-  Please sign form

## 2012-09-15 NOTE — Unmapped (Signed)
Pt would like to speak with MA

## 2012-09-18 NOTE — Unmapped (Signed)
Left message to call office

## 2012-09-19 NOTE — Unmapped (Signed)
Patient scheduled 8/14 will have MRI  And labs before appt

## 2012-09-19 NOTE — Unmapped (Signed)
Pt returning MA phone call

## 2012-09-25 NOTE — Unmapped (Signed)
90 day supply given

## 2012-09-25 NOTE — Unmapped (Addendum)
Pt stated MA Kristi called in a script for her, pt wants to know if you can call the insurance number to let them know this is not a brand new script that its a refill.  Make sure that it is for 90 Days     [employer]  Phone# [phone]

## 2012-10-05 NOTE — Unmapped (Signed)
Spoke with patient started Tecfidera 240mg  1 po bid on Monday (3 days ago)  She states that she has had diarrehea and nausea since then.  Advised on the pre medication protocol for Tecfidera.  Patient would like to speak with you before taking her afternoon dose.  She also is running fever.  I advised her she may need to see her PCP for evaluation

## 2012-10-05 NOTE — Unmapped (Signed)
Caller has questions in regards to med Tecfidera, pt is having extreme stomach pains, please call back asap

## 2012-12-26 MED ORDER — interferon beta-1a (REBIF) 44 mcg/0.5 mL injection
44 | SUBCUTANEOUS | 3.00 refills | 28.00000 days | Status: AC
Start: 2012-12-26 — End: ?

## 2012-12-26 NOTE — Unmapped (Signed)
Spoke with [employer] needing update Rx for patient Rebif for her PAP  Faxed to [phone]

## 2012-12-26 NOTE — Unmapped (Signed)
Former [employer] pt needing financial assistance. Please call back.

## 2013-01-01 NOTE — Unmapped (Signed)
Marcelino Duster is calling because she stated they still have not received the rebif's service request form, she would like to know if MA Kristi can refax the form back today?    Fax# [phone]

## 2013-01-01 NOTE — Unmapped (Signed)
Patient appt is in August per last messages patient having GI side effects to Tecfidera and requesting to go back on Rebif.  Will be faxing for for PAP

## 2013-01-02 NOTE — Unmapped (Signed)
I can not change or switch the DMT for patient I have not seen yet.   Please make sure pt takes the Tecfidera with food, has she tried OTC GI meds?   It is better to continue Tecfidera until come and see me but if she wants to switch now she has to be off of Tecifdera for at least two weeks before make the switched.

## 2013-01-02 NOTE — Unmapped (Signed)
Left message to call office

## 2013-01-03 NOTE — Unmapped (Signed)
Left message to patient to continue taking the Tercfidera-Hold on the Rebif until seen by Dr. Nilda Simmer call completed

## 2013-01-23 NOTE — Unmapped (Signed)
Patient schedule to be seen in 8/14 will get MRI and labs before seen

## 2013-03-12 NOTE — Unmapped (Signed)
Spoke with patient will being her labs and will have her MRI completed before visit.

## 2013-03-12 NOTE — Unmapped (Signed)
Left message to call office

## 2013-03-12 NOTE — Unmapped (Signed)
Pt returning MA Kristi phone call

## 2013-03-12 NOTE — Unmapped (Signed)
Pt would like to speak with MA Kristi, it in regards to the MRI

## 2013-03-16 NOTE — Unmapped (Signed)
MRI C-Spine with and without is scheduled for Monday at 4:00 pm at [facility]. Authorization through [facility], please call [phone] and her ID [id number]. [name] will need a call back before noon on Monday.

## 2013-03-16 NOTE — Unmapped (Signed)
PA form for MRI c-spine has been faxed to [employer]. I requested it be expedited and I will check the status Monday morning.

## 2013-03-16 NOTE — Unmapped (Signed)
Spoke to [employer] will leave for Monday.

## 2013-03-19 ENCOUNTER — Inpatient Hospital Stay: Admit: 2013-03-19 | Payer: PRIVATE HEALTH INSURANCE | Attending: Neurology

## 2013-03-19 DIAGNOSIS — G35 Multiple sclerosis: Secondary | ICD-10-CM

## 2013-03-19 MED ORDER — MAGNEVIST (gadopentetate dimeglumine) 11 mL
469.01 | Freq: Once | INTRAVENOUS | Status: AC | PRN
Start: 2013-03-19 — End: 2013-03-19
  Administered 2013-03-19: 22:00:00 via INTRAVENOUS

## 2013-03-19 NOTE — Unmapped (Signed)
MRI c-spine was approved. Auth # [id number]. Valid from 03/16/13-04/15/13.  I called [name] with Ins ver and provided her with auth information.

## 2013-03-20 ENCOUNTER — Ambulatory Visit: Admit: 2013-03-20 | Discharge: 2013-03-20 | Payer: PRIVATE HEALTH INSURANCE | Attending: Neurology

## 2013-03-20 DIAGNOSIS — G35 Multiple sclerosis: Secondary | ICD-10-CM

## 2013-03-20 NOTE — Unmapped (Signed)
Date:  03/20/2013  Name: Haley Ramos  Age:  47 y.o.  Referring Physician: Doyce Loose, MD  [address]  [address]  [place], [place] [address]  PCP:  Doyce Loose, MD  CC:  Multiple Sclerosis    Date Symptom   Onset/Outcome Brain MRI Spine MRI Tests DMT period A/E Meds   1998 Dizziness A/R  Incoordination A/R    Avonex 98-07    2007  PV+JC medullary  Rebif 07-13    2014                                    Onset: Acute/Gradual , Outcome: Resolved/Improved/Progressed, MRI: Peri Ventricular/Juxta Cortical,      HPI:      Haley Ramos is a 47 y.o. right handed female who came to Multiple Sclerosis clinic today. Below is what I have gathered from patient and chart review.    In 1998 she developed sudden onset of dizziness and kept missing putting key in her lock. She was in [place] and was diagnosed with MS based on MRI and CSF. She started Avonex until switched to Rebif in 2007.  She was prescribed Tecfidera by [facility] but did not started until last fall. She tried for a week and because of severe GI side effect she went back on Rebif. She took it with meal but still developed severe abdominal pain, nausea and vomiting. She is Ok to continue Rebif .    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 She has been on Rebif since 2007. She was diagnosed in 1998. 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 She reports numbness left arm improved. She has a history of bunion surgery on her left foot that left her with left great toe numbness and ball of her foot numb. She reports having blind spots occur in her vision when she was reading last week. She is very concerned that her MS has progressed.     Review of System:      All 14 item review of systems was unremarkable except as noted below:  She does not have any symptom currently     Cognition:  Ok   Head:   No double vision          Sensory/Pain:  No   Dexterities:  RH, works normal   Stiffness/Cramp: No   Gait:   Normal   Fatigue:  No   Sleep:   No   Incontinence:   No    Medications:      Current Outpatient Prescriptions   Medication Sig   ??? interferon beta-1a Inject 0.5 mLs (44 mcg total) subcutaneously every Monday, Wednesday, and Friday.     No current facility-administered medications for this visit.   Excedrine with Rebif shots helps with Migraine and FLS.      Allergies: Review of patient's allergies indicates no known allergies.    History:      PMHx:  has a past medical history of Migraines and MS (multiple sclerosis).    PSHx:  has past surgical history that includes Tonsillectomy (47 years old) and Bunionectomy (2004).    Social Hx: reports that she has never smoked. She does not have any smokeless tobacco history on file.    UEA:VWUJWJ history includes Migraines  in her other and Rheum arthritis in her other.    Physical Exam:      BP 108/60   Pulse 72   Ht 5' 1 (1.549 m)   Wt 121 lb (54.885 kg)   BMI 22.87 kg/m2    General:   NAD, no skin rash, breath regularly    Neurologic  Mental Status:Patient registered 3 objects without difficulty and recalled 3/3 after five minutes.  The patient was fully oriented and had a normal fund of knowledge.    Cranial Nerves: Color Vision12/12 OD and 12/12 OS, best corrected visual acuity was 20/20 OD and 20/20 OS.  There was no afferent pupillary deficit.  Funduscopic exam revealed normal optic nerve heads.   Horizontal and vertical eye movements were full.  There was no nystagmus.  Facial sensation was intact to pinprick.  There was no facial weakness.  Hearing was intact to finger rubbing bilaterally. Palate moved fully and symmetrically.  Tongue protruded in the midline.  There was no dysarthria.    Motor:       Strength: (MRC Scale)  R-L  R-L   Deltoid 5 - 5 Hip Fx   5 - 5   Biceps  5 - 5 Knee Ex 5 - 5   Triceps 5 - 5 Knee Fx 5 - 5   Wrist Ex 5 - 5 Plantar Fx 5 - 5   Grip 5 - 5 Ankle  dorsiflex 5 - 5      Tone was normal    Reflexes: (MRC Scale) R-L  R-L   Biceps  2 / 2 Quadriceps 4 / 4   Triceps  2 / 2 Achilles  3 / 3   Brachoradialis 2 / 2 Toes          Coordination: There was no ataxia on finger to nose or heel to shin testing.    Sensation: Vibration, position and pinprick sensation were intact in all four extremities.    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 Other areas of intramedullary signal alteration within the cervical cord are less conspicuous at this time, although prior study was obtained with a repeat magnet.   No abnormal enhancement is present within the spinal canal. Central disc protrusions at C4-C5 and C5-C6 are redemonstrated, the latter abutting the left anterolateral cord, stable  Impression:  Faint intramedullary T2 hyperintensity at the cervical medullary cord, similar to prior study and consistent with known multiple sclerosis  Other areas of intramedullary signal change on prior study are not as conspicuous on current exam-see above  Stable disc protrusions at C4-C5 and C5-C6.   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 She is clinically stable and DFAS on Rebif. If MRI also stable we will discuss a trial of stopping Rebif if she  is interested.     1. Multiple sclerosis  - Continue Rebif, call in a week to discuss MRI report. If stable will continue Rebif and if not will bring her back to discuss other options.   - Counseled for low fat diet  - Retrun in a year    2. Medication monitoring encounter    3. Vitamin D deficiency  - Start Vitamin D 2000 IU, check the level PCP office to see if need extra high dose of vitamin D      Medical Decision Making:   * review/order clinical lab tests   * review/order radiology tests   * review/order other diagnostic or tx interventions   * obtain old records or history from another person   * review and summarization of old records   * independent review of data- e.g. image- specimen   * Permanent chart problem/surgery list reviewed   * Permanent chart chronic med/ allergy list reviewed   * Permanent chart social/family history reviewed   * reviewed films     Risks & Benefits:   * Risks, benefits and treatment options discussed with patient.   Risk of complications: moderate   Time spent with patient:   New: 60 min   More than 50% of this time was spent discussing:   * Diagnosis   * Management   * Treatment Plan   * Diagnostic Results as indicated above    Raeanne Gathers, MD  Assistant Professor of Neurology   Director of [facility]   [address]   [place], [place] [address]????  [phone]

## 2013-03-20 NOTE — Unmapped (Addendum)
Plan:  Continue Rebif and call us in a week. If the MRI comparison done by radiologist shows that MS is table it is better to continue Rebif and if not we will bring you back to discuss other options.   LFT and CBC once a year  Check your Vitamin D level in next blood work and start taking Vitamin D 2000 IU daily     Vitamin D deficiency has been shown to be associated with Multiple Sclerosis disease activity. Below please find the result of your most recent vitamin D level . The goal for Vitamin D level in MS patients is around 70 ng/ml. Please consult with your PCP to help you achieve this goal. Until then you can follow Dr [place] Deal guidelines:     No results found for this basename: VITD25H       Vitamin D  Medication dose  < 10 ng  Vit D 2 - 50,000 IU - 1 tab qd x 14 days, then 2 tabs a wk x 2 mo, then 2 tabs per mo  10-20ng  Vit D2 - 50,000 IU - 2 tabs a week x 2 months, then 2 tabs per month   20-25ng  Vit D2 - 50,000 IU - 1 tab weekly x 2 months, then 2 tabs per month   25-30 ng  Vit D3 - 2000 IU tab daily   31-40   Vit D3 - 1000 IU tab daily       Life style modification that can change Multiple Sclerosis out come:   1. Low fate diet: read Dr. Gareth Morgan book The Multiple Sclerosis Diet Book  or Dr. Peggye Ley book The Starch Solution or watch Forks over Knives  2. Exercise (swimming, yoga)  3. Avoid alcohol and smoking

## 2014-02-27 NOTE — Unmapped (Signed)
Left message to call office to schedule follow up in August.  Patient can schedule to see [facility].  Please schedule when patient calls back to office

## 2014-03-28 NOTE — Unmapped (Signed)
Spoke with Jasmine December from [employer]. Patient needs a prior auth for her Rebif. Faxing form to office.

## 2014-03-28 NOTE — Unmapped (Signed)
Spoke with pharmacist and gave verbal for Rebif.

## 2014-03-28 NOTE — Unmapped (Signed)
PA approved from 02/26/14- 03/28/15. Case # [id number]

## 2014-03-28 NOTE — Unmapped (Signed)
Spoke with patient. Requesting a new script for her REBIF. Stated its cheaper for her as a 3 month supply. Provided PH # to call in script [phone]

## 2014-04-01 NOTE — Unmapped (Signed)
Pt requests 90 day with refills for Rebif, rather than 30 day.  Please advise pt when done.    Call Documentation     Delanna Notice, MA at 03/28/2014 10:51 AM     Status: Signed             Spoke with pharmacist and gave verbal for Rebif.              Canary Brim, LPN at 1/61/0960  9:15 AM     Status: Signed             Spoke with patient. Requesting a new script for her REBIF. Stated its cheaper for her as a 3 month supply. Provided PH # to call in script [phone]

## 2014-04-08 NOTE — Unmapped (Signed)
Left message to patient that 90 day supply as been verbally called to pharmacy-complete

## 2014-04-08 NOTE — Unmapped (Signed)
Please call the pt and let her know if the 90 day has been sent.

## 2014-05-13 ENCOUNTER — Ambulatory Visit: Admit: 2014-05-13 | Payer: PRIVATE HEALTH INSURANCE | Attending: Neurology

## 2014-05-13 DIAGNOSIS — G379 Demyelinating disease of central nervous system, unspecified: Secondary | ICD-10-CM

## 2014-05-13 NOTE — Unmapped (Signed)
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 If you decide to do so you need to get a baseline MRI.

## 2014-05-13 NOTE — Unmapped (Signed)
Date:  05/13/2014  Name: Haley Ramos  Age:  48 y.o.    Date Symptom   Onset/Outcome Brain MRI Spine MRI Tests DMT period A/E Meds   1998 Dizziness A/R  Incoordination A/R    Avonex 98-07    2007  PV+JC medullary  Rebif 07-13  Tecfidera 13 few  Rebif 2013    03/2013*         04/2014                           Onset: Acute/Gradual , Outcome: Resolved/Improved/Progressed, MRI: Peri Ventricular/Juxta Cortical,      HPI:      05/13/14:  She denies any new symptoms and exacerbation. She had a blood work done by Dr. Dutch Gray and was told CBC and LFT was [facility].    03/20/13 first visit:  In 1998 she developed sudden onset of dizziness and kept missing putting key in her lock. She was in [place] and was diagnosed with MS based on MRI and CSF. She started Avonex until switched to Rebif in 2007.  She was prescribed Tecfidera by Talbert Forest but did not started until last fall. She tried for a week and because of severe GI side effect she went back on Rebif. She took it with meal but still developed severe abdominal pain, nausea and vomiting. She is Ok to continue Rebif .      Review of System:      Cognition:  Ok   Head:   No double vision     Sensory/Pain:  No   Dexterities:  RH, works normal   Stiffness/Cramp: No   Gait:   Normal   Fatigue:  No   Sleep:   No   Incontinence:   No    Medications:      Current Outpatient Prescriptions   Medication Sig   ??? interferon beta-1a (albumin) Inject 0.5 mLs (44 mcg total) subcutaneously every Monday, Wednesday, and Friday.     No current facility-administered medications for this visit.   Excedrine with Rebif shots helps with Migraine and FLS.      Allergies: Review of patient's allergies indicates no known allergies.    History:      PMHx:  has a past medical history of Migraines and MS (multiple sclerosis).    PSHx:  has past surgical history that includes Tonsillectomy (48 years old) and Bunionectomy (2004).    Social Hx: reports that she has never smoked. She does not have any smokeless  tobacco history on file.    JXB:JYNWGN history includes Migraines in her other; Rheum arthritis in her other.    Physical Exam:      BP 122/82   Pulse 61   Ht 5' 1 (1.549 m)   Wt 120 lb (54.432 kg)   BMI 22.69 kg/m2  Here is my exam from previous visits. I repeated some of these exams and updated the changes.   Mental Status:Patient registered 3 objects without difficulty and recalled 3/3 after five minutes.  The patient was fully oriented and had a normal fund of knowledge.  Cranial Nerves: Color Vision12/12 OD and 12/12 OS, best corrected visual acuity was 20/20 OD and 20/20 OS.  There was no afferent pupillary deficit.  Funduscopic exam revealed normal optic nerve heads.   Horizontal and vertical eye movements were full.  There was no nystagmus.  Facial sensation was intact to pinprick.  There was no facial weakness.  Hearing was  intact to finger rubbing bilaterally. Palate moved fully and symmetrically.  Tongue protruded in the midline.  There was no dysarthria.  Motor:       Strength: (MRC Scale)  R-L  R-L   Deltoid 5 - 5 Hip Fx   5 - 5   Biceps  5 - 5 Knee Ex 5 - 5   Triceps 5 - 5 Knee Fx 5 - 5   Wrist Ex 5 - 5 Plantar Fx 5 - 5   Grip 5 - 5 Ankle dorsiflex 5 - 5    Tone was normal  Reflexes: (MRC Scale) R-L  R-L   Biceps  2 / 2 Quadriceps 4 / 4   Triceps  2 / 2 Achilles  3 / 3   Brachoradialis 2 / 2 Toes      Coordination: There was no ataxia on finger to nose or heel to shin testing.  Sensation: Vibration, position and pinprick sensation were intact in all four extremities.  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 Other areas of intramedullary signal alteration within the cervical cord are less conspicuous at this time, although prior study was obtained with a repeat magnet.   No abnormal enhancement is present within the spinal canal. Central disc protrusions at C4-C5 and C5-C6 are redemonstrated, the latter abutting the left anterolateral cord, stable  Impression:  Faint intramedullary T2 hyperintensity at the cervical medullary cord, similar to prior study and consistent with known multiple sclerosis  Other areas of intramedullary signal change on prior study are not as conspicuous on current exam-see above  Stable disc protrusions at C4-C5 and C5-C6.      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 Except the initial attack in 1998 she has never had any other attack and her MRI until 2014 remained stable. She is not interested in repeating MRI this year which is reasonable. We discuss about low probability of having attack after this many years and I offered her to stop Rebif but she is hesitant to do so. I asked her we would need a baseline MRI and close monitoring if she change her mind and decide to try stopping Rebif.     1. Multiple sclerosis  - Rebif, if she decide to stop it she would need a baseline MRI otherwise will repeat MRI next year  - PCP is monitoring drug toxicity   - RTC 1 year unless decide to stop Rebif     2. Vitamin D deficiency  - PCP is monitoring her levels    Medical Decision Making:   * review/order clinical lab tests   * review/order radiology tests   * review/order other diagnostic or tx interventions   * obtain old records or history from another person   * review and summarization of old records   * independent review of data- e.g. image-  specimen   * Permanent chart problem/surgery list reviewed   * Permanent chart chronic med/  allergy list reviewed   * Permanent chart social/family history reviewed   * reviewed films     Risks & Benefits:   * Risks, benefits and treatment options discussed with patient.   Risk of complications: moderate   Time spent with patient: 30 min   More than 50% of this time was spent discussing:   * Diagnosis   * Management   * Treatment Plan   * Diagnostic Results as indicated above    Raeanne Gathers, MD  Assistant Professor of Neurology   Director of [facility]   [address]   [place], [place] [address]????  [phone]

## 2015-11-03 NOTE — Unmapped (Signed)
MRIs order was placed

## 2015-11-03 NOTE — Unmapped (Signed)
Pt is calling to request an order for an MRI of the brain be placed. Pls advise so that she can schedule.

## 2015-11-04 NOTE — Unmapped (Signed)
Left message to patient MRI of the B, C and T were placed.  Number given to call and schedule-complete

## 2015-11-17 ENCOUNTER — Inpatient Hospital Stay: Admit: 2015-11-17 | Payer: PRIVATE HEALTH INSURANCE | Attending: Neurology

## 2015-11-17 DIAGNOSIS — G35 Multiple sclerosis: Secondary | ICD-10-CM

## 2015-11-17 MED ORDER — GADAVIST (gadobutrol) Soln 5 mL
1 | Freq: Once | INTRAVENOUS | Status: AC | PRN
Start: 2015-11-17 — End: 2015-11-17
  Administered 2015-11-17: 23:00:00 5 mL/kg via INTRAVENOUS

## 2016-06-29 ENCOUNTER — Emergency Department: Admit: 2016-06-29 | Payer: PRIVATE HEALTH INSURANCE

## 2016-06-29 ENCOUNTER — Emergency Department: Payer: PRIVATE HEALTH INSURANCE

## 2016-06-29 ENCOUNTER — Inpatient Hospital Stay
Admit: 2016-06-29 | Discharge: 2016-06-29 | Disposition: A | Discharge status: Home / Self Care | Payer: PRIVATE HEALTH INSURANCE

## 2016-06-29 DIAGNOSIS — R42 Dizziness and giddiness: Secondary | ICD-10-CM

## 2016-06-29 LAB — URINALYSIS W/RFL TO MICROSCOPIC
Bilirubin, UA: NEGATIVE
Blood, UA: NEGATIVE
Glucose, UA: NEGATIVE mg/dL
Ketones, UA: NEGATIVE mg/dL
Leukocytes, UA: NEGATIVE
Nitrite, UA: NEGATIVE
Protein, UA: NEGATIVE mg/dL
Specific Gravity, UA: 1.009 (ref 1.005–1.035)
Urobilinogen, UA: <2 mg/dL (ref 0.2–1.9)
pH, UA: 5 (ref 5.0–8.0)

## 2016-06-29 LAB — BASIC METABOLIC PANEL
Anion Gap: 9 mmol/L (ref 3–16)
BUN: 12 mg/dL (ref 7–25)
CO2: 22 mmol/L (ref 21–33)
Calcium: 8.9 mg/dL (ref 8.6–10.3)
Chloride: 109 mmol/L (ref 98–110)
Creatinine: 0.76 mg/dL (ref 0.60–1.30)
Glucose: 95 mg/dL (ref 70–100)
Osmolality, Calculated: 290 mOsm/kg (ref 278–305)
Potassium: 4.3 mmol/L (ref 3.5–5.3)
Sodium: 140 mmol/L (ref 133–146)
eGFR AA CKD-EPI: >90 See note.
eGFR NONAA CKD-EPI: >90 See note.

## 2016-06-29 LAB — CBC
Hematocrit: 42.5 % (ref 35.0–45.0)
Hemoglobin: 14.2 g/dL (ref 11.7–15.5)
MCH: 31.1 pg (ref 27.0–33.0)
MCHC: 33.5 g/dL (ref 32.0–36.0)
MCV: 92.7 fL (ref 80.0–100.0)
MPV: 9.8 fL (ref 7.5–11.5)
Platelets: 272 10E3/uL (ref 140–400)
RBC: 4.58 10E6/uL (ref 3.80–5.10)
RDW: 12.5 % (ref 11.0–15.0)
WBC: 9.1 10E3/uL (ref 3.8–10.8)

## 2016-06-29 LAB — DIFFERENTIAL
Basophils Absolute: 36 /uL (ref 0–200)
Basophils Relative: 0.4 % (ref 0.0–1.0)
Eosinophils Absolute: 46 /uL (ref 15–500)
Eosinophils Relative: 0.5 % (ref 0.0–8.0)
Lymphocytes Absolute: 2548 /uL (ref 850–3900)
Lymphocytes Relative: 28 % (ref 15.0–45.0)
Monocytes Absolute: 701 /uL (ref 200–950)
Monocytes Relative: 7.7 % (ref 0.0–12.0)
Neutrophils Absolute: 5769 /uL (ref 1500–7800)
Neutrophils Relative: 63.4 % (ref 40.0–80.0)

## 2016-06-29 LAB — DDIMER: D-Dimer: <0.27 ug{FEU}/mL (ref 0.00–0.50)

## 2016-06-29 LAB — TROPONIN I: Troponin I: <0.04 ng/mL (ref 0.00–0.03)

## 2016-06-29 MED ORDER — sodium chloride 0.9 % 1,000 mL IV fluid
Freq: Once | INTRAVENOUS | Status: AC
Start: 2016-06-29 — End: 2016-06-29
  Administered 2016-06-29: 17:00:00 1000 mL via INTRAVENOUS

## 2016-06-29 MED FILL — SODIUM CHLORIDE 0.9 % INTRAVENOUS SOLUTION: 1000.00 1000.00 mL | INTRAVENOUS | Qty: 1000

## 2016-06-29 NOTE — ED Notes (Signed)
Family at bedside.

## 2016-06-29 NOTE — ED Notes (Signed)
Plan of care discussed with pt by Tx team. Pt given DCD instructions and paper work. Pt given opportunity to express any concerns/ questions. Pt verbalizes understanding denies any questions or concerns at this time. Pt encouraged to follow up w/ PCP and to return to ER for any concerns or emergent health care needs.

## 2016-06-29 NOTE — ED Triage Notes (Signed)
Pt arrives to ED via EMS from work with c/o near syncope and dizziness starting this morning. Pt reports nausea, palpitations, and lightheadedness. Pt arrives A&Ox4, RR e/u, skin pwd, PA at bedside.

## 2016-06-29 NOTE — ED Notes (Signed)
Patient is resting comfortably.

## 2016-06-29 NOTE — Unmapped (Signed)
[place] ED Note  Date of Service: 06/29/2016    Reason for Visit: Near Syncope      Patient History     HPI: Haley Ramos is a 50 y.o. female who presented to the emergency department with Near Syncope  Patient presents here after near syncope episode and feeling lightheaded this morning.  Patient states she went to the gym and worked out and ate breakfast and went to work.  Patient states she was standing during a meeting at work and started feeling lightheaded so she sat down.  She continued to feel lightheaded and it took approximately 20-30 minutes for her to be able to stand on her elbow without feeling like she was going to pass out.  Patient states she does still feel lightheaded now but slightly better.  She also complained of heart palpitations that have been intermittent this morning.  States she felt some chest tightness.  Denies any chest pain or exertional pain.  Denies any acute pain now.  Denies any vertigo or syncope.  Denies any neck pain or stiffness, abdominal pain or back pain.  Denies any nausea, vomiting or diarrhea.  Denies any recent travel, leg pain or swelling or history of blood clots.  She is currently being treated for sinusitis with Augmentin and feels that the symptoms have improved with her antibiotics.  Denies dyspnea, productive cough,  or hemoptysis.  Patient states she did have recent blood work including thyroid studies done by her PCP last month which were within normal limits.       Past Medical History:   Diagnosis Date   ??? Migraines    ??? MS (multiple sclerosis) (HCC)        Past Surgical History:   Procedure Laterality Date   ??? BUNIONECTOMY  2004    left foot   ??? HYSTERECTOMY     ??? TONSILLECTOMY  50 years old       Haley Ramos  reports that she has never smoked. She has never used smokeless tobacco. She reports that she drinks alcohol. She reports that she does not use drugs.    Previous Medications     INTERFERON BETA-1A (REBIF) 44 MCG/0.5 ML INJECTION    Inject 0.5 mLs (44 mcg total) subcutaneously every Monday, Wednesday, and Friday.       Allergies:   Allergies as of 06/29/2016   ??? (No Known Allergies)       Review of Systems     ROS:  Patient denies any headache, vision changes, dizziness,  or syncope.  Patient denies any neck pain or stiffness, fever or chills.  Patient denies any shortness of breath,  or cough.  Patient denies abdominal pain, nausea, vomiting, diarrhea.  Patient denies any back pain.  Denies dysuria, hematuria, bowel or bladder incontinence.  Patient denies any paresthesias or weakness.  Review of systems otherwise negative per patient.      Physical Exam     ED Triage Vitals [06/29/16 1051]   Vital Signs Group      Temp       Temp src       Heart Rate 67      Heart Rate Source Monitor      Resp 18      SpO2 100 %      BP (!) 142/96      BP Location Right arm      BP Method Automatic      Patient Position Sitting  SpO2 100 %   O2 Device None (Room air)       General: Alert and oriented x3, in no acute distress, nontoxic in appearance.  Ambulatory with steady gait.     HEENT: Normocephalic, atraumatic, PERRLA, EOMI, conjunctiva and sclera clear.  Mucosa moist and pink.  No sinus tenderness to palpation.   TMs are clear.  Pharynx without erythema or exudate. Uvula midline.  No facial swelling.    Neck: Supple without adenopathy.  No JVD, bruits, or stridor noted.  Trachea is midline.  Full range of motion of neck.    Pulmonary:   Lungs are clear to auscultation without wheezes, rales or rhonchi.    Cardiac:  Regular rate and rhythm.  No murmurs, rubs, or gallops.    Abdomen: Positive bowel sounds all four quadrants, soft, nontender, nondistended.  No rebound, no guarding.  No masses noted.    Musculoskeletal: Full range of motion of all extremities.  No ecchymosis, edema, erythema noted.    Back:  Negative costovertebral angle tenderness.  No spinous process tenderness of the cervical,  thoracic or lumbar spine.    Vascular:  2+ pulses in all extremities    Skin:  Warm and dry to touch, no lesions or rashes noted    Neuro:  Alert oriented x3, cranial nerves II through XII intact.  Moving all extremities.  Sensory and motor intact.  No acute focal deficits.    Psych: Normal affect    Diagnostic Studies     Labs:    Please see electronic medical record for any tests performed in the ED     Radiology:    Please see electronic medical record for any tests performed in the ED    EKG:    Indication near syncope/ palpitations    EKG Interpretation    Interpreted by Dr. Lynnae January    Rhythm: normal sinus   Rate: normal HR 69  Axis: normal  Ectopy: none  Conduction: normal  ST Segments: no acute change  T Waves: no acute change  Q Waves: none    Clinical Impression: no acute changes and normal EKG    Emergency Department Procedures         ED Course and MDM     Haley Ramos is a 50 y.o. female who presented to the emergency department with Near Syncope  Patient presents with complaints of lightheadedness and near-syncope with her palpitations while at work today.  Patient is starting to feel better now.  EKG was done which showed normal sinus rhythm.  Chest x-ray showed No acute disease.  Urinalysis was negative.  Patient was not orthostatic by pulse or by blood pressure.  CBC was within normal limits with white count 9.1 with hemoglobin of 14.2 and 272 platelets.  D-dimer was less than 0.27.  Troponin was less than 0.04.  BMP is within normal limits with creatinine 0.7 and glucose of 95.  Patient is feeling better.  She was given IV fluids.  She will be referred to PCP for follow-up.  If chest pain, shortness of breath, fevers, vomiting, passing out or worsening symptoms she can return emergency department.      Clinical Impression:  1. Lightheaded        The patient was seen and evaluated by my attending physician, Jonna Munro, MD who agrees with my assessment, treatment and plan.      [place], [place]  06/29/16 1242

## 2016-06-29 NOTE — ED Provider Notes (Signed)
ED Attending Attestation Note    Date of service:  06/29/2016    This patient was seen by the advanced practice provider.  I have seen and examined the patient, agree with the workup, evaluation, management and diagnosis. The care plan has been discussed.  I have reviewed the ECG and concur with the advanced practice provider's interpretation.    My assessment reveals a 50 y.o. female presents with episodes of almost passing out, 6 in total, primarily since it with a change in position. No episodes in the emergency department patient's very well-appearing, hemodynamically stable and without orthostasis. No chest pain, although she did have subjective tachycardia during the first episode and without chest pain or shortness of breath, no normal EKG here without evidence of preexcitation, given the acuity of presentation of low suspicion for ACS, low suspicion of PE as she is completely back to normal in between these episodes. Is consistent with orthostatic/vasovagal presyncope.

## 2016-06-29 NOTE — ED Notes (Signed)
Patient updated on plan of care and informed of any delays.  Call light is in reach and bed rails up for safety.  No concerns voiced at this time. The patient was advised to use call light if any concerns or questions.

## 2016-06-29 NOTE — Unmapped (Signed)
Drink plenty of fluids.  If chest pain, shortness of breath, passing out, headaches or worsening symptoms return emergency department.

## 2016-07-01 NOTE — Telephone Encounter (Signed)
Patient advised-complete

## 2016-07-01 NOTE — Telephone Encounter (Signed)
Daughter states pt went to ER Tuesday with near syncope, generalized toothache.  Was discharged and episode occurred again today.  Pt states her forehead and cheeks are numb, all her teeth ache.  Please advise.

## 2016-07-01 NOTE — Telephone Encounter (Signed)
I have not seen her for more than two year so unfortunately can not give an accurate advise. She probably needs to go to ER again or talk to her PCP to rule out other causes as MS usually does not cause syncope, we also will bring her to clinic for reevaluation.

## 2016-07-02 ENCOUNTER — Inpatient Hospital Stay
Admit: 2016-07-02 | Discharge: 2016-07-02 | Disposition: A | Discharge status: Home / Self Care | Payer: PRIVATE HEALTH INSURANCE

## 2016-07-02 DIAGNOSIS — F419 Anxiety disorder, unspecified: Secondary | ICD-10-CM

## 2016-07-02 MED ORDER — LORazepam (ATIVAN) 1 MG tablet
1 | ORAL_TABLET | Freq: Three times a day (TID) | ORAL | 0 refills | Status: AC | PRN
Start: 2016-07-02 — End: 2016-07-05

## 2016-07-02 NOTE — Unmapped (Signed)
Seen here three days ago.  Dx vasovagal syncope.   Complains of palpitations, sob, tingling both arms, facial numbness, off and on for couple days, now more frequent

## 2016-07-02 NOTE — ED Provider Notes (Signed)
[facility] ED Note    Reason for Visit: Palpitations and Facial Numbness      Patient History     HPI: Haley Ramos is a 50 y.o. female with a history of MS, migraines who presents to the Emergency Department with An episode of anxiety. The patient states that she was here earlier in the week, had an episode where she almost passed out 4 times. At that time she had a negative evaluation including troponin, d-dimer, and EKG. Today she presents stating that she has not had any more of those symptoms, posterior work today, started to feel anxious, had numbness across her forehead and numbness at the tips of both fingers, associated with heavy breathing. His resolve spontaneously, without evidence of syncope or presyncope at this time. She is currently asymptomatic on presentation. Of note she states she had an MRI done in March, but is been too anxious to receive the results.     Other than above, there are no modifying, alleviating or aggravating factors.    Past Medical History:   Diagnosis Date    Migraines     MS (multiple sclerosis) (HCC)        Past Surgical History:   Procedure Laterality Date    BUNIONECTOMY  2004    left foot    HYSTERECTOMY      TONSILLECTOMY  50 years old        reports that she has never smoked. She has never used smokeless tobacco. She reports that she drinks alcohol. She reports that she does not use drugs.    Previous Medications    INTERFERON BETA-1A (REBIF) 44 MCG/0.5 ML INJECTION    Inject 0.5 mLs (44 mcg total) subcutaneously every Monday, Wednesday, and Friday.       Allergies:   Allergies as of 07/02/2016    (No Known Allergies)       Review of Systems     ROS: Please refer to the HPI for pertinent positives and negatives, all of the systems were reviewed and were negative unless otherwise stated.    Physical Exam     Vitals:    07/02/16 1451   BP: 145/77   BP Location: Left arm   Patient Position: Sitting   Pulse: 65    Resp: 16   Temp: 98.3 F (36.8 C)   TempSrc: Oral   SpO2: 96%   Weight: 123 lb (55.8 kg)   Height: 5' 1.75 (1.568 m)       Constitutional:Well-developed, well-nourished female, no acute distress  Eyes:  PERRL, EOMI, conjunctiva normal   HEENT:  Normocephalic, atraumatic, no meningismus  Respiratory:  No respiratory distress  Cardiovascular: Regular rate and rhythm  GI:  Nondistended, no peritonitis  Musculoskeletal:  No peripheral edema  Neurologic:  Alert & oriented x 4, normal motor function, normal sensory function without focal deficits  Psychiatric:  Speech and behavior appropriate       Diagnostic Studies     Labs:    Radiology:  No orders to display       ED Course and MDM     Haley Ramos is a 50 y.o. female who presented to the emergency department withAn episode of forehead numbness and numbness in her fingertips associated with heavy breathing. This consistent with anxiety, consistent with previous presyncopal episodes. She is very well-appearing and asymptomatic on arrival. I did look up her MRI results, which made her anxious and up to walk out of the room, however  she was very reassured and hugged me when it showed no new MS changes. Given the temporary nature, and syncopal episode of an nonanatomic distribution of numbness, do not feel this is multiple sclerosis. She has an appointment scheduled with her neurologist.    The patient was very reassured, we discussed the risks and benefits of location therapy, and her and her family members agreed to use precautions to not drive or operate machinery, and we will give a brief prescription of when necessary Ativan, 0.5 milligrams, has a psychiatric way to get her into her primary care physician, which she agreed to do.    Clinical Impression:   Anxiety    Plan:  Surgical medical condition  PCP follow-up  Ativan 0.5 milligrams every 8 hours when necessary x 9             Jonna Munro, MD  07/02/16 1553

## 2016-07-02 NOTE — ED Notes (Signed)
Introduced self to pt and family  Offered comfort measures  Pt alert, awake, unlabored, family at bedside, denies further needs at this time, will continue to monitor.

## 2016-07-02 NOTE — ED Notes (Signed)
Pt alert, awake, unlabored. Discharge instructions given, pt verbalized understanding. Pt discharged home.

## 2016-07-02 NOTE — ED Triage Notes (Signed)
Patient states she is here due to having muscle spasms in her chest. Patient states it makes her feel like her heart is sitting on her right side. Patient thinks her MS is to blame.

## 2016-07-06 ENCOUNTER — Ambulatory Visit: Admit: 2016-07-06 | Payer: PRIVATE HEALTH INSURANCE | Attending: Neurology

## 2016-07-06 DIAGNOSIS — M5030 Other cervical disc degeneration, unspecified cervical region: Secondary | ICD-10-CM

## 2016-07-06 NOTE — Progress Notes (Signed)
Date Symptom   Onset/Outcome Brain MRI Spine MRI Tests DMT period A/E Meds   1998 Dizziness A/R  Incoordination A/R    Avonex 98-07    2007  PV+JC medullary  Rebif 07-13  Tecfidera 13 few  Rebif 13-16    03/2013*         04/2014         06/2016                  Onset: Acute/Gradual , Outcome: Resolved/Improved/Progressed, MRI: Peri Ventricular/Juxta Cortical,      HPI:      07/06/16:  Last Tuesaddy she had a syncope proceed with heart racing and feeling pounding in chest feeling heavy in chest, she was out for   She went home and same thing happened multiple times. ER discharged with Dx of Vasovagal but Wednesday same thing happened again. She had another spell in Friday. She went to ER again, this time diagnosed with panic attack.   She started on Augmentin for toothache before this spells starts then switched to Z-pac and Motrin.     She stopped Rebif about 1.5 years ago.     05/13/14:  She denies any new symptoms and exacerbation. She had a blood work done by Dr. Dutch Gray and was told CBC and LFT was [facility].    03/20/13 first visit:  In 1998 she developed sudden onset of dizziness and kept missing putting key in her lock. She was in [place] and was diagnosed with MS based on MRI and CSF. She started Avonex until switched to Rebif in 2007.  She was prescribed Tecfidera by Tal[facility] but did not started until last fall. She tried for a week and because of severe GI side effect she went back on Rebif. She took it with meal but still developed severe abdominal pain, nausea and vomiting. She is Ok to continue Rebif .      Review of System:      Cognition:  Ok   Head:   No double vision     Sensory/Pain:  No   Dexterities:  RH, works normal   Stiffness/Cramp: No   Gait:   Normal   Fatigue:  No   Sleep:   No   Incontinence:   No    Medications:      Current Outpatient Prescriptions   Medication Sig    interferon beta-1a (albumin) Inject 0.5 mLs (44 mcg total) subcutaneously every Monday, Wednesday, and Friday.     No current  facility-administered medications for this visit.    Excedrine with Rebif shots helps with Migraine and FLS.      Allergies: Patient has no known allergies.    History:      PMHx:  has a past medical history of Migraines and MS (multiple sclerosis) (HCC).    PSHx:  has a past surgical history that includes Tonsillectomy (50 years old); Bunionectomy (2004); and Hysterectomy.    Social Hx: reports that she has never smoked. She has never used smokeless tobacco. She reports that she drinks alcohol. She reports that she does not use drugs.    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    Physical Exam:      BP 130/76 (BP Location: Right arm, Patient Position: Sitting, BP Cuff Size: Regular)   Pulse 84   Ht 5' 1 (1.549 m)   Wt 124 lb (56.2 kg)   BMI 23.43 kg/m   Here is my exam  from previous visits. I repeated some of these exams and updated the changes.   Mental Status:Patient registered 3 objects without difficulty and recalled 3/3 after five minutes.  The patient was fully oriented and had a normal fund of knowledge.  Cranial Nerves: Color Vision12/12 OD and 12/12 OS, best corrected visual acuity was 20/20 OD and 20/20 OS.  There was no afferent pupillary deficit.  Funduscopic exam revealed normal optic nerve heads.   Horizontal and vertical eye movements were full.  There was no nystagmus.  Facial sensation was intact to pinprick.  There was no facial weakness.  Hearing was intact to finger rubbing bilaterally. Palate moved fully and symmetrically.  Tongue protruded in the midline.  There was no dysarthria.  Motor:       Strength: (MRC Scale)  R-L  R-L   Deltoid 5 - 5 Hip Fx   5 - 5   Biceps  5 - 5 Knee Ex 5 - 5   Triceps 5 - 5 Knee Fx 5 - 5   Wrist Ex 5 - 5 Plantar Fx 5 - 5   Grip 5 - 5 Ankle dorsiflex 5 - 5    Tone was normal  Reflexes: (MRC Scale) R-L  R-L   Biceps  2 / 2 Quadriceps 4 / 4   Triceps  2 / 2 Achilles  3 / 3   Brachoradialis 2 / 2 Toes      Coordination:  There was no ataxia on finger to nose or heel to shin testing.  Sensation: Vibration, position and pinprick sensation were intact in all four extremities.  Gait and station:  Able to do tandem walk   25 foot walk 03/20/2013 05/13/2014 07/06/2016   Did the patient wear an ankle foot orthosis (AFO)? No No No   Was an assistive device used? No No No   Trial No. 1: Completed Completed -   Time for 25 Foot Walk: 4.18 secs 4.51secs 3.98 secs       Tests:      11/17/15 compare to 03/19/13  Brain MRI  Chronic multiple sclerosis has not changed since the prior study from 2014. No enhancing lesion or diffusion restriction has developed  C-spine:  Chronic multiple sclerosis has not definitely changed since the prior study. No new cord lesion has developed. No abnormal enhancement is identified.  Significant decrease disc protrusion at C5-6 with otherwise similar appearance of mild noncompressive multilevel cervical degenerative disc disease.  T-spine:  No evidence of the demyelinating process involving the thoracic cord.  Mild degenerative disc disease with right central disc protrusion at T7-8 causing minimal ventral cord indentation    03/19/13:  Brain: PV+JC+Medullary demyelinating lesions  C-spine: multi level DDD with mild ventral cord compression     03/20/2013   MRI cervical spine without and with IV contrast  History: Multiple sclerosis  Pre-and postcontrast imaging was acquired comparison with 11/15/2011  Patchy T2 intramedullary hyperintensity is redemonstrated at the cervical medullary cord, similar to prior study. Other areas of intramedullary signal alteration within the cervical cord are less conspicuous at this time, although prior study was obtained with a repeat magnet.   No abnormal enhancement is present within the spinal canal. Central disc protrusions at C4-C5 and C5-C6 are redemonstrated, the latter abutting the left anterolateral cord, stable  Impression:  Faint intramedullary T2 hyperintensity at the cervical  medullary cord, similar to prior study and consistent with known multiple sclerosis  Other areas of intramedullary signal change on prior  study are not as conspicuous on current exam-see above  Stable disc protrusions at C4-C5 and C5-C6.    Lab Results   Component Value Date    WBC 9.1 06/29/2016    HGB 14.2 06/29/2016    HCT 42.5 06/29/2016    MCV 92.7 06/29/2016    PLT 272 06/29/2016     Lab Results   Component Value Date    GLUCOSE 95 06/29/2016    BUN 12 06/29/2016    CO2 22 06/29/2016    CREATININE 0.76 06/29/2016    K 4.3 06/29/2016    NA 140 06/29/2016    CL 109 06/29/2016    CALCIUM 8.9 06/29/2016     No results found for: VITD25H    Assessment/Plan:      Haley Ramos is a 50 y.o. female with clinically isolated syndrome. Except the initial attack in 1998 she has never had any other attack and her MRI in 2017 remained stable about a year after she stopped Rebif in 2016.   Recent episodes of syncope proceed with heart racing and chest heaviness could be cardiac or psychological    1. Multiple sclerosis  - Stopped Rebif in 2016  - Repeat MRI next year  - Return after MRI     2. Vitamin D deficiency (goal 50-70)  - PCP started her on high dose Vitamin D and is monitoring her levels    3. DDD (degenerative disc disease), cervical  - Amb referral to Physical Therapy    4. Syncope   - Offered referral to Cardiologist, she decided to consult her PCP first who has appointment tomorrow morning    5. Anxiety  - probably benefit from anxiolytic, she will talk to PCP    Total time spent with patient was 30 minutes.  Greater than 50% of that time was spent counseling and coordinating the care of the patient.  Please see my assessment and plan for details.    Raeanne Gathers, MD  Assistant Professor of Neurology   Director of [facility]   [address]   [place], [place] [address]  [phone]

## 2016-07-06 NOTE — Unmapped (Addendum)
Refer to PT for neck disease   Brain MRI in 1 year   Return in 1 year     Healthy life style might have positive effect on Multiple Sclerosis symptoms and even outcome. Some of them include:   1. Low salt and low fat diet, suggestions about diet books: Pearletha Alfred Diet by Dr. Smith Mince,  The Multiple Sclerosis Diet Book by Dr. Mendel Corning, The Starch Solution by Dr. Marijo Conception and [facility] over Knives   2. Routine exercise (swimming, yoga, ...)  3. Vitamin D level goal 50-70   4. Avoid alcohol and smoking

## 2016-07-14 NOTE — Telephone Encounter (Signed)
Spoke with patient.  Was seen last week.  Patient had ECG and was normal.  Has appt. Monday to see cardiologist.  Questioning whether this is her MS?  Having vertigo and  hot spots all over her body for the past couple days. No other symptoms or illness.

## 2016-07-14 NOTE — Telephone Encounter (Signed)
Pt calling to speak with MA regarding her appt today, she states she is having some symptoms that she feels may be MS, pls advise

## 2016-07-14 NOTE — Telephone Encounter (Signed)
Her presentation is not typical for MS and based on last week evaluation I feel these are stress related or from cardiac source. If they do not get better [facility] or I will be happy to see her next week.

## 2016-07-15 NOTE — Telephone Encounter (Signed)
Patient advised-complete

## 2016-07-26 NOTE — Telephone Encounter (Signed)
Pt called in. She wants to go back on her Rebif, would like a script called in pls advise

## 2016-07-27 NOTE — Telephone Encounter (Signed)
Pt called again, will obtain medication through [facility].  She will call back with more info.

## 2016-07-27 NOTE — Telephone Encounter (Signed)
Pt called back with fax number.       Fax [phone]

## 2016-07-28 NOTE — Telephone Encounter (Signed)
Pt states she was returning a call from yesterday. Requests a call back as soon as possible.

## 2016-07-28 NOTE — Telephone Encounter (Signed)
Spoke with pt advised her I will fax form to [facility].

## 2016-07-28 NOTE — Telephone Encounter (Signed)
Form corrected and faxed back to [facility].

## 2016-07-28 NOTE — Telephone Encounter (Signed)
Per Luther Parody, titration pack box not marked on Rebif form, but maintenance dose box was checked, they sent form back a short time ago, can correct and fax back to [phone].

## 2016-07-30 NOTE — Telephone Encounter (Signed)
Message complete

## 2016-08-03 NOTE — Telephone Encounter (Signed)
Neuro Triage Questions 08/03/2016   What are your new symptoms? In particular, ask about vision changes and trouble walking. heat sensation on back arms legs, facial numbness, left foot and arm weakness, vision problems, numbness in fingers and all over the body   When did your symptoms begin?  about a month ago   Have you had an exacerbation / received IV steroids in the past? Did you tolerate it OK and did it work for you?  yes about 10 yrs ago;it was tolerated and did work   MS Comments: pt described vision issues and not dizziness

## 2016-08-03 NOTE — Telephone Encounter (Signed)
I spoke to Mr Orland Penman who reports that his wife Haley Ramos is not doing well at all.  He reports that the hot sensations across back and legs, muscle fatigue, and left leg heaviness and intermittent vision problems as well as  numbness and tingling in her fingers have not improved since her last visit with Dr. Genelle Gather a month ago.  At the time they thought this was cardiac related, however she has since had extensive cardiac workup which has been unremarkable so far.  He would like for her to try steroids at this point or at least repeat MRI since he feels that these symptoms are related to MS. She is in the process of resuming Rebif.  I offered appointment for evaluation tomorrow which the patient's husband declined stating she is at work till 5pm and cannot come in and will be leaving town later in the week, I also recommended bloodwork and UA which he also felt was not going to give additional information since the patient already had negative UA done earlier when she waslalready dealing with the symptoms.   I tried to explain to him that global worsening of symptoms is not very consistent with MS exacerbation and is usually secondary to an underlying issue. He states that he would at least like for her to try steroids or get MRI while waiting to resume Rebif.

## 2016-08-04 ENCOUNTER — Ambulatory Visit: Admit: 2016-08-04 | Payer: PRIVATE HEALTH INSURANCE

## 2016-08-04 DIAGNOSIS — G379 Demyelinating disease of central nervous system, unspecified: Secondary | ICD-10-CM

## 2016-08-04 MED ORDER — DULoxetine (CYMBALTA) 60 MG capsule
60 | ORAL_CAPSULE | Freq: Every day | ORAL | 11 refills | Status: AC
Start: 2016-08-04 — End: 2016-09-03

## 2016-08-04 MED ORDER — DULoxetine (CYMBALTA) 30 MG capsule
30 | ORAL_CAPSULE | Freq: Every day | ORAL | 0 refills | Status: AC
Start: 2016-08-04 — End: 2016-08-11

## 2016-08-04 MED ORDER — DULoxetineCYMBALTA30MGcapsule
30 | ORAL_CAPSULE | Freq: Every day | ORAL | 0 refills | Status: AC
Start: 2016-08-04 — End: 2016-08-04

## 2016-08-04 MED ORDER — DULoxetine (CYMBALTA) 60 MG capsule
60 | ORAL_CAPSULE | Freq: Every day | ORAL | 11 refills | Status: AC
Start: 2016-08-04 — End: 2016-08-04

## 2016-08-04 NOTE — Unmapped (Signed)
Date Symptom   Onset/Outcome Brain MRI Spine MRI Tests DMT period A/E Meds   1998 Dizziness A/R  Incoordination A/R    Avonex 98-07    2007  PV+JC medullary  Rebif 07-13  Tecfidera 13 few  Rebif 13-16    03/2013*         04/2014         06/2016                  Onset: Acute/Gradual , Outcome: Resolved/Improved/Progressed, MRI: Peri Ventricular/Juxta Cortical,      HPI:    08/04/16  I had the pleasure of evaluating Haley Ramos.  She is getting ready to resume Rebif which she should be getting tomorrow.  Since her last visit she has not had any improvement in the symptoms she has been having.  She did see a cardiologist who did workup including a Holter monitor which was unremarkable except for a few PVCs and PACs.  She reports that she continues to have sensations of her heart pounding which she can also fell in her throat. She reports intermittent weakness in arms and legs with left foot heaviness, and heat sensation between her shoulder blades. Both eyes and ears also hurt intermittently.  She also has intermittent dizziness.  These symptoms occur approximately 5 times a day and usually last only a few minutes and for no more than an hour.   She reports that she is a very anxious person at baseline and worries constantly about everything but she has never really been on a medication for anxiety except for a brief period on Zoloft.          07/06/16:  Last Tuesaddy she had a syncope proceed with heart racing and feeling pounding in chest feeling heavy in chest, she was out for   She went home and same thing happened multiple times. ER discharged with Dx of Vasovagal but Wednesday same thing happened again. She had another spell in Friday. She went to ER again, this time diagnosed with panic attack.   She started on Augmentin for toothache before this spells starts then switched to Z-pac and Motrin.     She stopped Rebif about 1.5 years ago.     05/13/14:  She denies any new symptoms and exacerbation. She had a blood  work done by Dr. Dutch Gray and was told CBC and LFT was [facility].    03/20/13 first visit:  In 1998 she developed sudden onset of dizziness and kept missing putting key in her lock. She was in [place] and was diagnosed with Haley based on MRI and CSF. She started Avonex until switched to Rebif in 2007.  She was prescribed Tecfidera by Talbert Forest but did not started until last fall. She tried for a week and because of severe GI side effect she went back on Rebif. She took it with meal but still developed severe abdominal pain, nausea and vomiting. She is Ok to continue Rebif .      Review of System:      Cognition:  Ok   Head:   No double vision     Sensory/Pain:  No   Dexterities:  RH, works normal   Stiffness/Cramp: No   Gait:   Normal   Fatigue:  No   Sleep:   No   Incontinence:   No    Medications:      Current Outpatient Prescriptions   Medication Sig   ??? interferon beta-1a (albumin) Inject 0.5 mLs (  44 mcg total) subcutaneously every Monday, Wednesday, and Friday.     No current facility-administered medications for this visit.    Excedrine with Rebif shots helps with Migraine and FLS.      Allergies: Patient has no known allergies.    History:      PMHx:  has a past medical history of Migraines and Haley (multiple sclerosis) (HCC).    PSHx:  has a past surgical history that includes Tonsillectomy (50 years old); Bunionectomy (2004); and Hysterectomy.    Social Hx: reports that she has never smoked. She has never used smokeless tobacco. She reports that she drinks alcohol. She reports that she does not use drugs.    UXL:KGMWNU history includes Migraines in her other; Rheum arthritis in her other.    Physical Exam:      BP 106/64    Pulse 68    Ht 5' 1 (1.549 m)    Wt 121 lb (54.9 kg)    BMI 22.86 kg/m??     General: NAD  Resp:     Clear Bilaterally, regular unlabored breaths  CV:     RRR  No Edema, no skin rashes    Mental Status:Patient registered 3 objects without difficulty and recalled 3/3 after five minutes.  The patient was  fully oriented and had a normal fund of knowledge.  Cranial Nerves: Color Vision12/12 OD and 12/12 OS, best corrected visual acuity was 20/20 OD and 20/20 OS.  There was no afferent pupillary deficit.  Funduscopic exam revealed normal optic nerve heads.   Horizontal and vertical eye movements were full.  There was no nystagmus.  Facial sensation was intact to pinprick.  There was no facial weakness.  Hearing was intact to finger rubbing bilaterally. Palate moved fully and symmetrically.  Tongue protruded in the midline.  There was no dysarthria.  Motor:       Strength: (MRC Scale)  R-L  R-L   Deltoid 5 - 5 Hip Fx   5 - 5   Biceps  5 - 5 Knee Ex 5 - 5   Triceps 5 - 5 Knee Fx 5 - 5   Wrist Ex 5 - 5 Plantar Fx 5 - 5   Grip 5 - 5 Ankle dorsiflex 5 - 5    Tone was normal  Reflexes: (MRC Scale) R-L  R-L   Biceps  2 / 2 Quadriceps 4 / 4   Triceps  2 / 2 Achilles  3 / 3   Brachoradialis 2 / 2 Toes      Coordination: There was no ataxia on finger to nose or heel to shin testing.  Sensation: Vibration, position and pinprick sensation were intact in all four extremities.  Gait and station:  Able to do tandem walk   25 foot walk 03/20/2013 05/13/2014 07/06/2016   Did the patient wear an ankle foot orthosis (AFO)? No No No   Was an assistive device used? No No No   Trial No. 1: Completed Completed -   Time for 25 Foot Walk: 4.18 secs 4.51secs 3.98 secs       Tests:      11/17/15 compare to 03/19/13  Brain MRI  Chronic multiple sclerosis has not changed since the prior study from 2014. No enhancing lesion or diffusion restriction has developed  C-spine:  Chronic multiple sclerosis has not definitely changed since the prior study. No new cord lesion has developed. No abnormal enhancement is identified.  Significant decrease disc protrusion at C5-6 with  otherwise similar appearance of mild noncompressive multilevel cervical degenerative disc disease.  T-spine:  No evidence of the demyelinating process involving the thoracic cord.  Mild  degenerative disc disease with right central disc protrusion at T7-8 causing minimal ventral cord indentation    03/19/13:  Brain: PV+JC+Medullary demyelinating lesions  C-spine: multi level DDD with mild ventral cord compression     03/20/2013   MRI cervical spine without and with IV contrast  History: Multiple sclerosis  Pre-and postcontrast imaging was acquired comparison with 11/15/2011  Patchy T2 intramedullary hyperintensity is redemonstrated at the cervical medullary cord, similar to prior study. Other areas of intramedullary signal alteration within the cervical cord are less conspicuous at this time, although prior study was obtained with a repeat magnet.   No abnormal enhancement is present within the spinal canal. Central disc protrusions at C4-C5 and C5-C6 are redemonstrated, the latter abutting the left anterolateral cord, stable  Impression:  Faint intramedullary T2 hyperintensity at the cervical medullary cord, similar to prior study and consistent with known multiple sclerosis  Other areas of intramedullary signal change on prior study are not as conspicuous on current exam-see above  Stable disc protrusions at C4-C5 and C5-C6.    Lab Results   Component Value Date    WBC 9.1 06/29/2016    HGB 14.2 06/29/2016    HCT 42.5 06/29/2016    MCV 92.7 06/29/2016    PLT 272 06/29/2016     Lab Results   Component Value Date    GLUCOSE 95 06/29/2016    BUN 12 06/29/2016    CO2 22 06/29/2016    CREATININE 0.76 06/29/2016    K 4.3 06/29/2016    NA 140 06/29/2016    CL 109 06/29/2016    CALCIUM 8.9 06/29/2016     No results found for: VITD25H    Assessment/Plan:      Haley Ramos is a 50 y.o. female with clinically isolated syndrome.   She was previously on Rebif whch she ha discontinued about 8 months ago which she is currently in the process of resuming Rebif which she expects to get this week.  She presented today for evaluation of ongoing x 5/6 weeks transient symptoms of heart palpitation, dizziness,  generalized weakness, eye and ear pain, as well as burning sensation between her shoulder blades,occuring up to 5 times a day and each episode lasting just a few minutes.   Extensive cardiology workup was unremarkable.  Although the patient is concerned about new disease acitivity, transient global worsening of symptoms is not consistent with acute Haley activity and my suspicion for anxiety/ panic attacks being the cause is much higher especially given that the patient endorses a constant state of worrying about everything.  She is getting ready to resume Rebif and therefore I do not see the utility in repeating the MRI at this time even if there was new Haley activity as it would be more relevant when repeated in a couple of months after she has been on Rebif for approximately 3 months.  In the meantime I do think it is imperative to address the underlying anxiety and I started her on Cymbalta and also referred her to see a psychotherapist for counseling.         1. Multiple sclerosis (HCC)  - Start Rebif as soon as you recieve  - Differential; Standing  - CBC; Standing  - Vitamin D 25 hydroxy; Future  - Comprehensive Metabolic Panel, Serum; Future  - MRI Head  W WO contrast; Future  - MRI Cervical spine W WO contrast; Future  - MRI Thoracic spine W WO contrast; Future  - F/U with Dr. Genelle Gather in 3.5 months    2. Anxiety  - Psychotherapy  - Start Cymbalta 30mg  once daily x 7 days  - Increase Cymbalta to 60mg  daily after 1 week    3. DDD (degenerative disc disease), cervical  - Follow through with Physical Therapy referral      Medical Decision Making:   * review/order clinical lab tests   * review/order radiology tests   * review/order other diagnostic or tx interventions   * obtain old records or history from another person   * review and summarization of old records   * independent review of data- e.g. image- specimen   * Permanent chart problem/surgery list reviewed   * Permanent chart chronic med/ allergy list reviewed    * Permanent chart social/family history reviewed   * reviewed films     Risks & Benefits:   * Risks, benefits and treatment options discussed with patient.   Risk of complications: moderate   Time spent with patient:   New: 45 min   More than 50% of this time was spent discussing:   * Diagnosis   * Management   * Treatment Plan   * Diagnostic Results as indicated above    Kristie Cowman CNP  [facility]   [address]   [place], [place] [address]  [phone]

## 2016-08-04 NOTE — Unmapped (Addendum)
Start Rebif as ordered  Bloodwork 1 month after   MRI 3 months after starting Rebif   F/U 3.5 months with Dr. Genelle Gather    ====================================================    Start Cymbalta 30mg  once daily x 7 days    Increase Cymbalta to 60mg  daily after 1 week    Referral to Psychotherapy

## 2016-08-04 NOTE — Telephone Encounter (Signed)
Patient has appt today for Haley Ramos

## 2016-11-23 ENCOUNTER — Ambulatory Visit: Payer: PRIVATE HEALTH INSURANCE | Attending: Neurology

## 2017-06-23 NOTE — Telephone Encounter (Signed)
Left message to call office and schedule a follow up with Dr. Genelle Gather.  Please schedule when patient calls back to office

## 2019-08-24 NOTE — Telephone Encounter (Signed)
This former patient is calling to ask if it is safe for her to get the COVID vaccination. Pt states she no longer lives here.

## 2019-08-24 NOTE — Telephone Encounter (Signed)
Called her back. Explained since she hasn't been here in so long we can't really advise her on the vaccine. She stated that her PCP told her it was safe for her to get.

## 2022-01-07 IMAGING — MR MRI CERVICAL SPINE W/WO CONTRAST
6 of 9 series · 23 of 48 positions shown · IV contrast (gadavist)
Comparison: MRI cervical spine from November 17, 2015.

________________________________________________________________________________________________ 
MRI CERVICAL SPINE W/WO CONTRAST, 01/07/2022 [DATE]: 
CLINICAL INDICATION: Multiple Sclerosis.
TECHNIQUE: Multiplanar, multiecho position MR images of the cervical spine were 
performed without and with 5 mL of Gadavist were injected intravenously by hand. 
2.5 mL of Gadavist were discarded. Patient was scanned on a 1.5T magnet.

[Series 801: survey*csp · axial · 10.0mm · 1.56mm/px · z∈[-140,+98]mm · 5 of 15 slices shown]
[im 1/15]
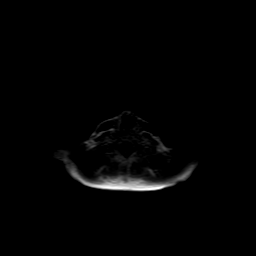
[im 4/15]
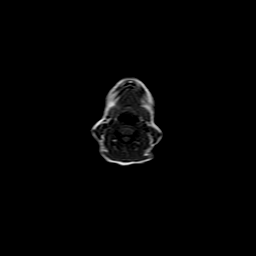
[im 8/15]
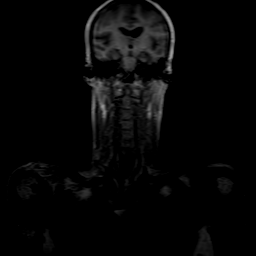
[im 11/15]
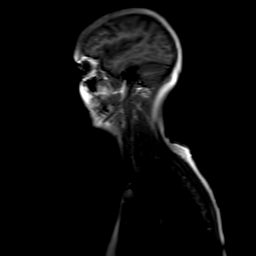
[im 15/15]
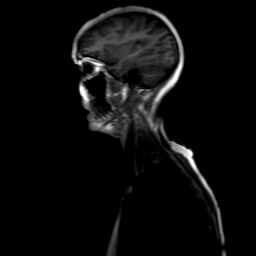

[Series 901: t2w_cor-surv · coronal · 5.0mm · 0.85mm/px · 2 of 9 slices shown]
[im 1/9]
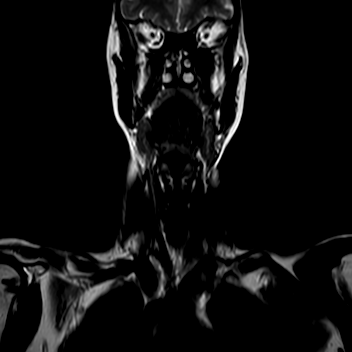
[im 9/9]
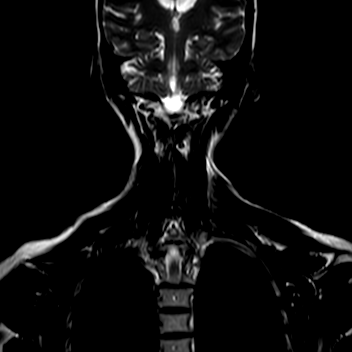

[Series 1001: t1_sag · sagittal · 3.0mm · 0.41mm/px · 4 of 15 slices shown]
[im 1/15]
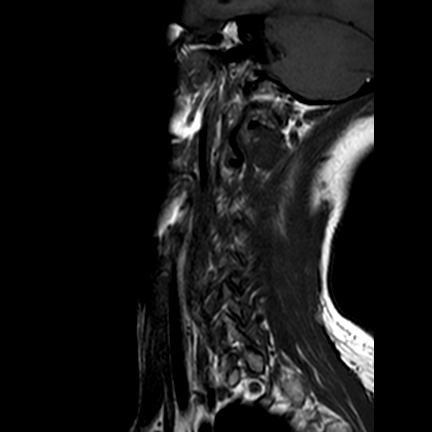
[im 5/15]
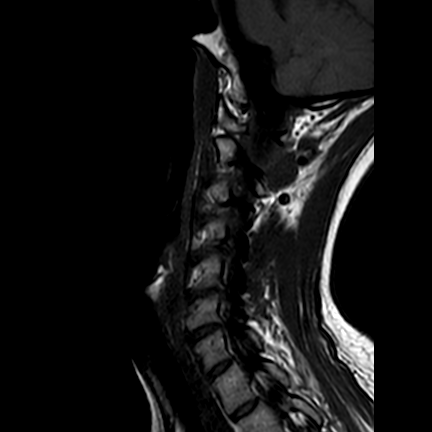
[im 10/15]
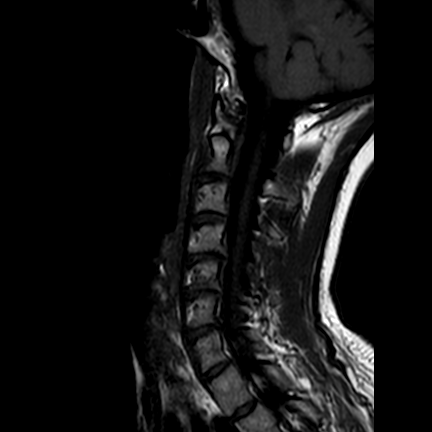
[im 15/15]
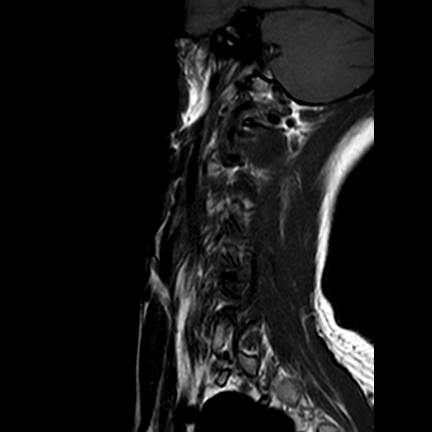

[Series 1101: t2w_mv_xd_sag · sagittal · 3.0mm · 0.28mm/px · 4 of 15 slices shown]
[im 1/15]
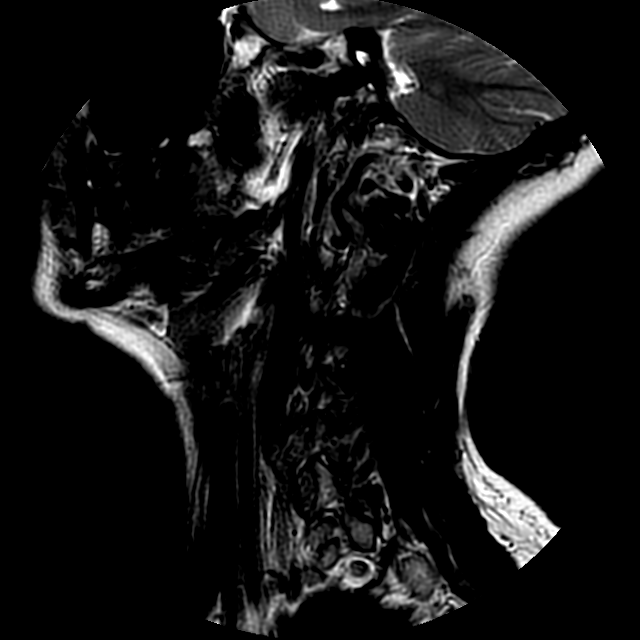
[im 5/15]
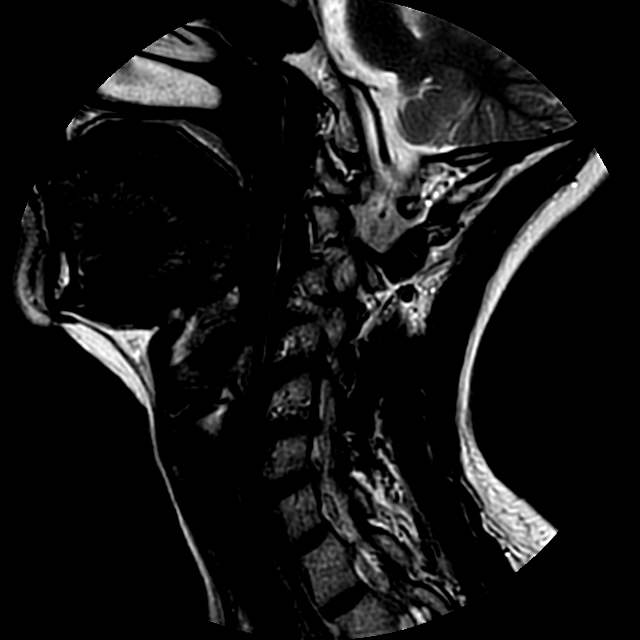
[im 10/15]
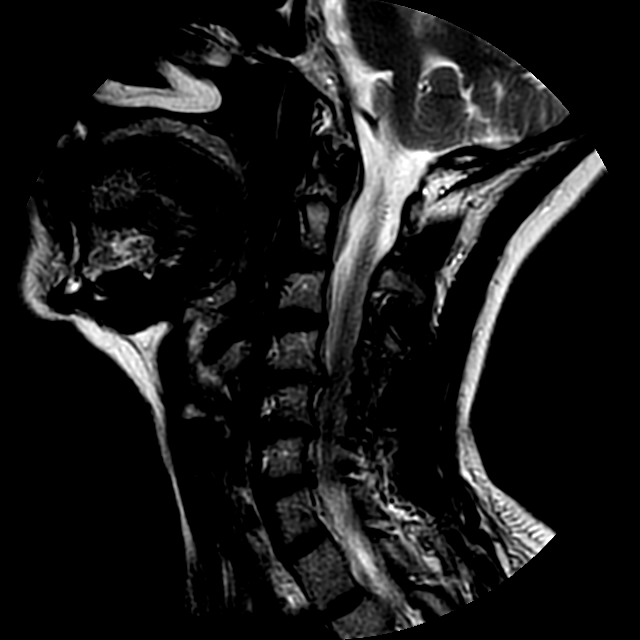
[im 15/15]
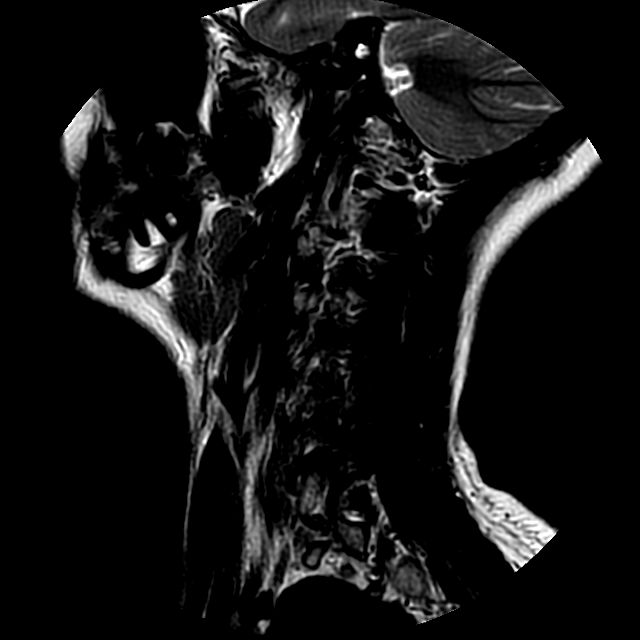

[Series 1201: stir_sag · sagittal · 3.0mm · 0.41mm/px · 4 of 15 slices shown]
[im 1/15]
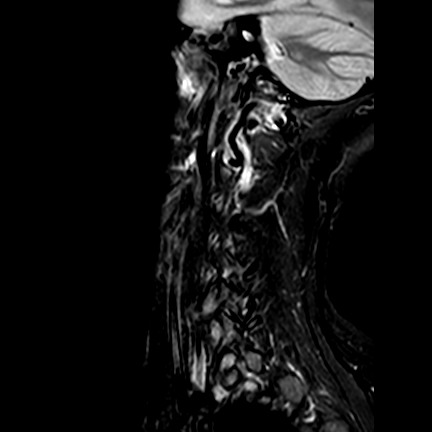
[im 5/15]
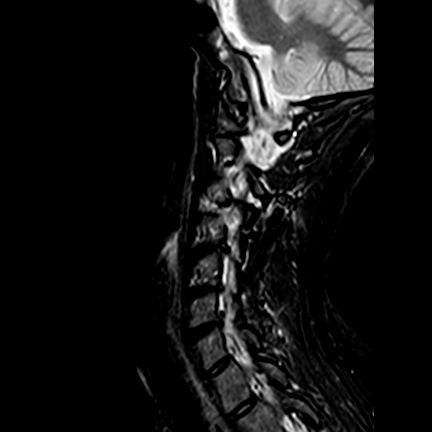
[im 10/15]
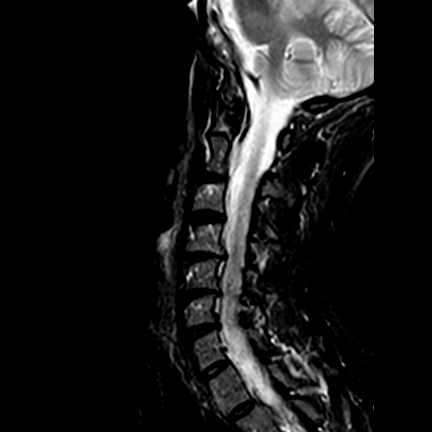
[im 15/15]
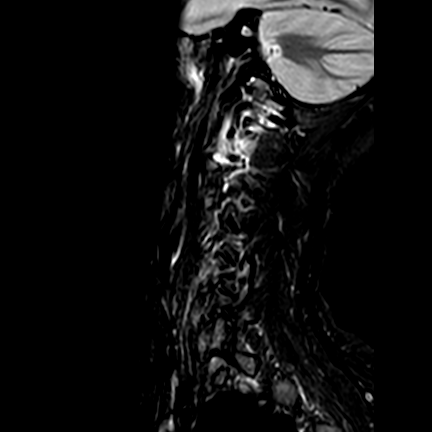

[Series 1301: t2_(person_name)_(person_name) · axial · 3.0mm · 0.29mm/px · z∈[-174,-130]mm · 4 of 38 slices shown]
[im 1/38]
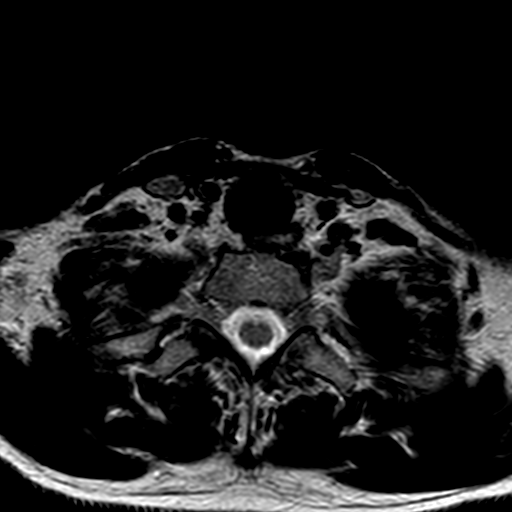
[im 8/38]
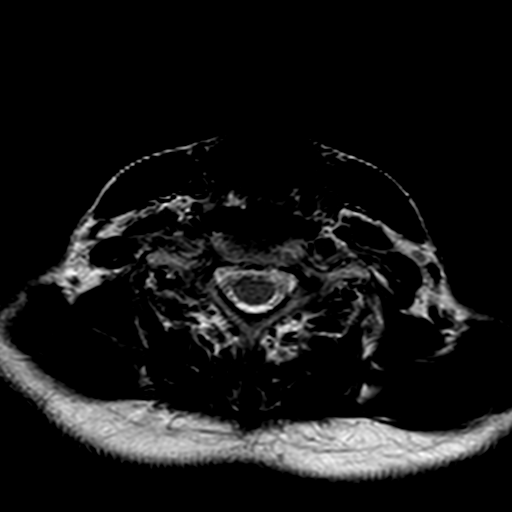
[im 12/38]
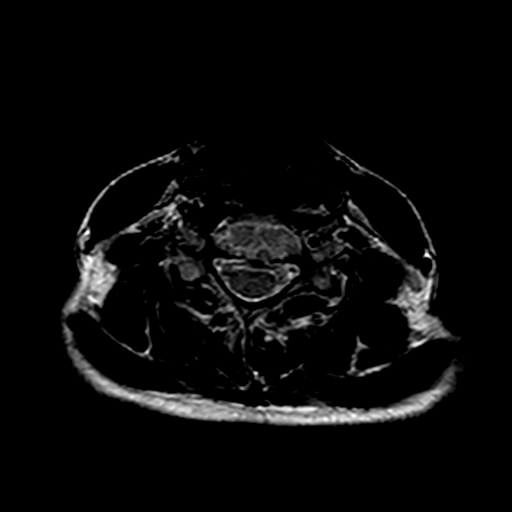
[im 15/38]
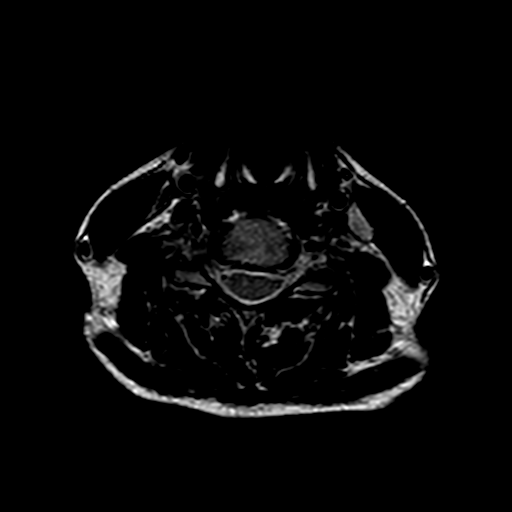

[23 of 48 positions shown; findings below may reference images not displayed]

FINDINGS: -------------------------------------------------------------------------------- 
----------------- 
GENERAL: 
Stable focal T2 prolongation along the right upper cervical cord at C1 level. 
Stable lesion along the right hemicord at C3-C4. There is T2 prolongation in 
right hemicord at the C6-C7 level which is not clearly present on the prior 
examination, although the prior examination was degraded by motion. No 
pathologic enhancement. 
Mild retrolisthesis of C5 relative to C6. No focal suspect marrow signal 
abnormality. Visualized extraspinal soft tissues are unremarkable. 
-------------------------------------------------------------------------------- 
---------------- 
SEGMENTAL:  
CRANIOCERVICAL JUNCTION: No significant stenosis. 
C2-C3: No significant central canal or neural foraminal narrowing. 
C3-C4: No significant central canal or neural foraminal narrowing. 
C4-C5: Disc osteophyte complex with bilateral uncovertebral joint hypertrophy. 
Mild central canal narrowing. Mild bilateral neural foraminal narrowing. This 
level is stable.  
C5-C6: Disc osteophyte complex with central disc herniation. Deformity of the 
ventral cord. Mild to moderate central canal narrowing. No significant neural 
foraminal narrowing. This level is stable. 
C6-C7: Disc osteophyte complex with mild bilateral uncovertebral joint 
hypertrophy. No significant central canal or neural foraminal narrowing. This 
level is stable. 
C7-T1: No significant central canal or neural foraminal narrowing. 
-------------------------------------------------------------------------------- 
---------------
IMPRESSION: Lesions in the cord consistent with provided history of multiple sclerosis. 
Stable lesions at C1 and C3-C4. There is a lesion at C6-C7 which was not clearly 
seen on prior examination, possibly secondary to motion on the prior 
examination. 
No pathologic enhancement. 
Stable discogenic/degenerative changes as above with disc herniation at C5-6 is 
causing deformity of the ventral cord.

## 2022-01-07 IMAGING — MR MRI BRAIN W/WO CONTRAST
4 of 13 series · 15 of 48 positions shown · IV contrast (gadavist)
Comparison: MRI brain from November 17, 2015.

________________________________________________________________________________________________ 
MRI BRAIN W/WO CONTRAST, 01/07/2022 [DATE]: 
CLINICAL INDICATION: Multiple Sclerosis.
TECHNIQUE: Multiplanar, multiecho position MR images of the brain were performed 
without and with 5 mL of Gadavist were injected intravenously by hand. 2.5 mL of 
Gadavist were discarded.  Patient was scanned on a 1.5T magnet.

[Series 101: survey · axial · 10.0mm · 0.94mm/px · 1 of 5 slices shown]
[im 1/5]
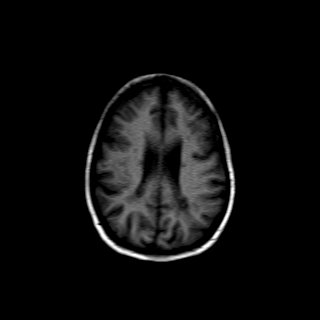

[Series 601: SWI · axial · 3.0mm · 0.36mm/px · z∈[-47,+102]mm · 9 of 200 slices shown]
[im 1/200]
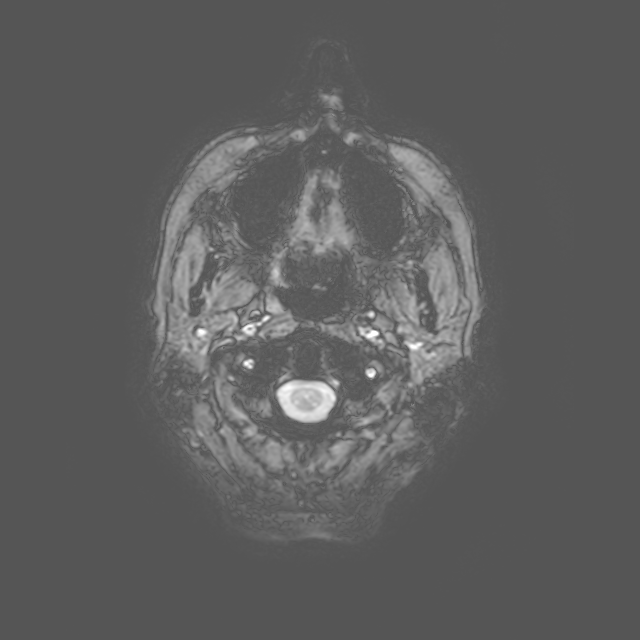
[im 29/200]
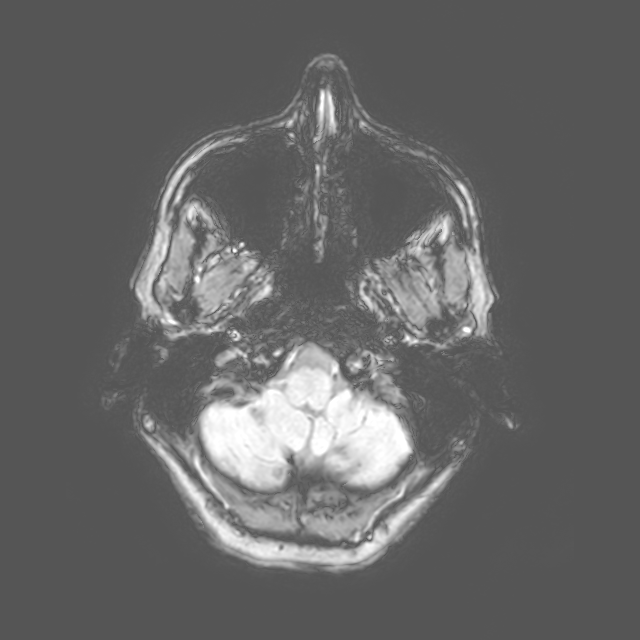
[im 57/200]
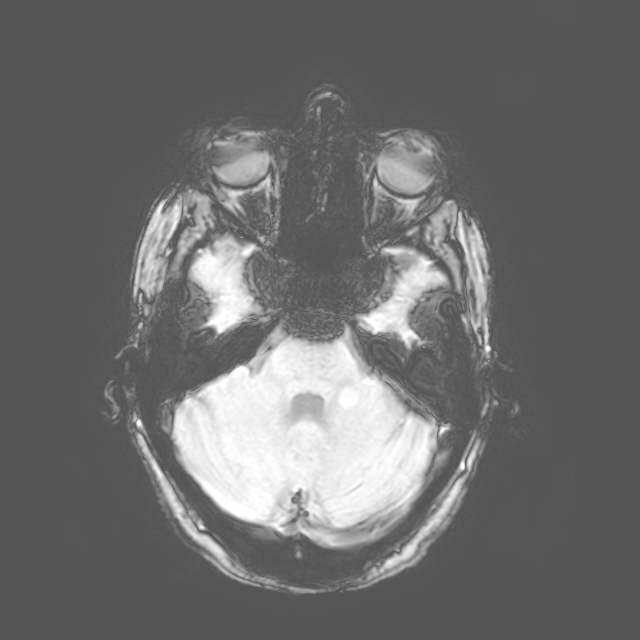
[im 86/200]
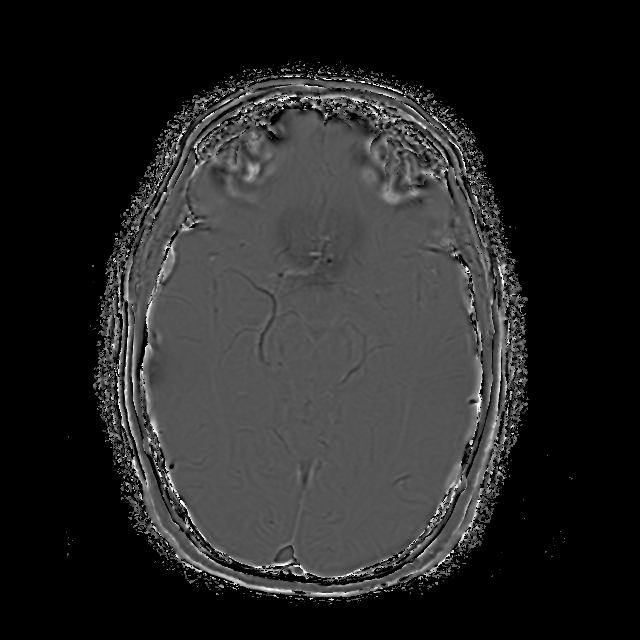
[im 100/200]
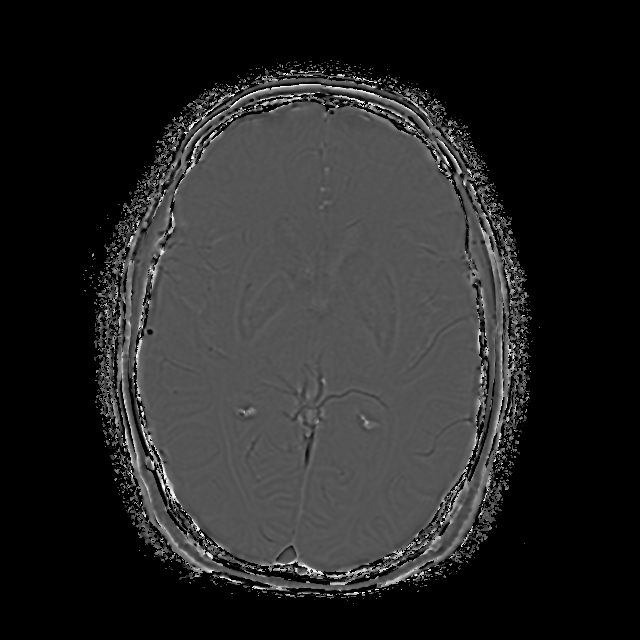
[im 114/200]
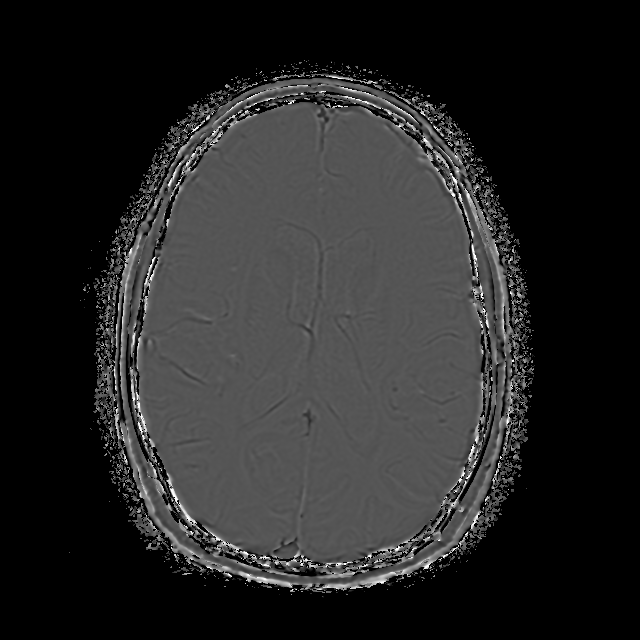
[im 143/200]
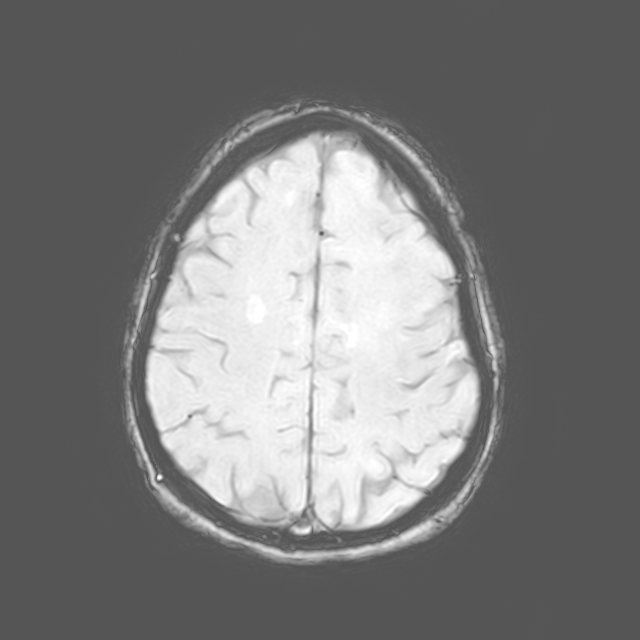
[im 171/200]
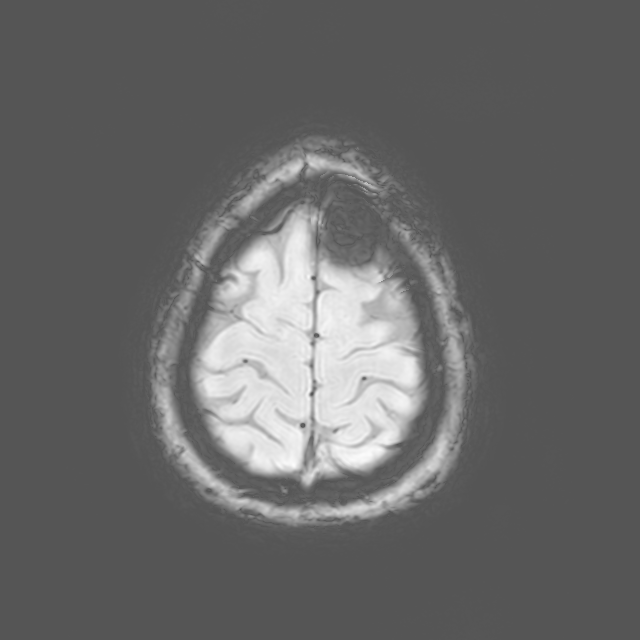
[im 200/200]
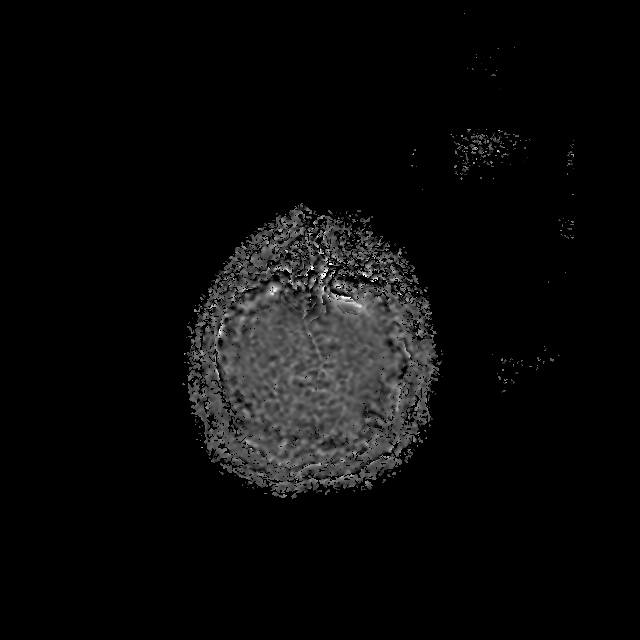

[Series 1601: T1 post-contrast · axial · 5.0mm · 0.43mm/px · z∈[-51,+105]mm · 2 of 27 slices shown (1 of 2)]
[im 1/27]
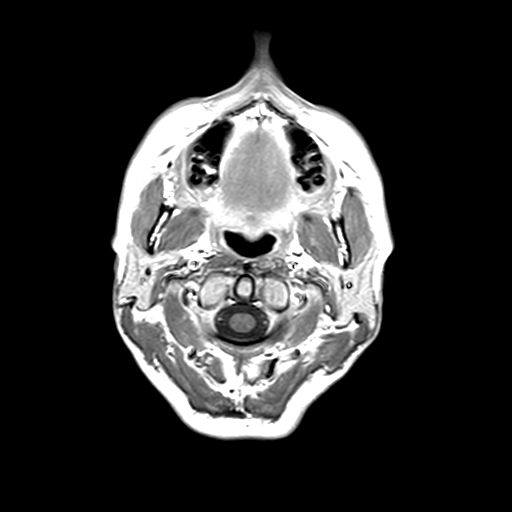
[im 27/27]
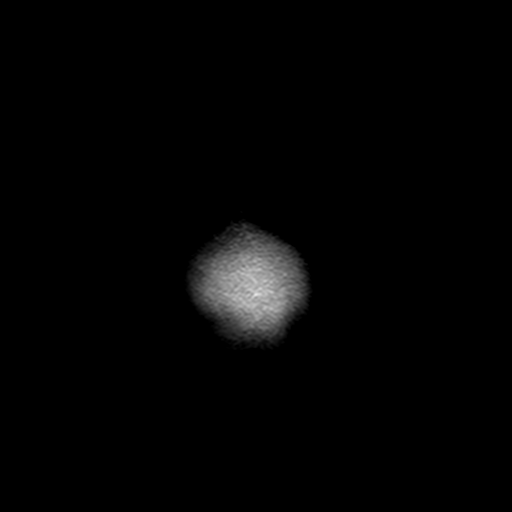

[Series 1801: T1 post-contrast · coronal · 4.0mm · 0.41mm/px · 3 of 36 slices shown (2 of 2)]
[im 1/36]
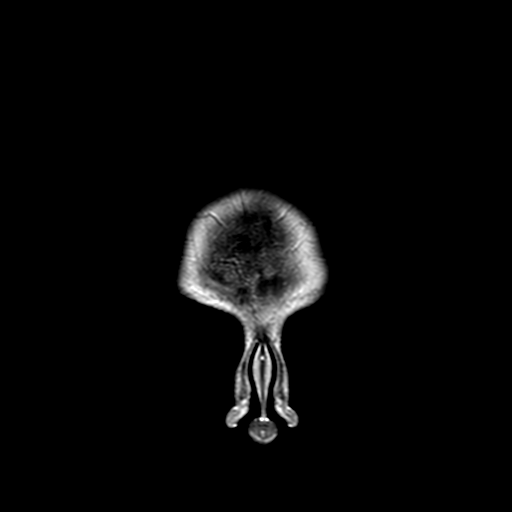
[im 18/36]
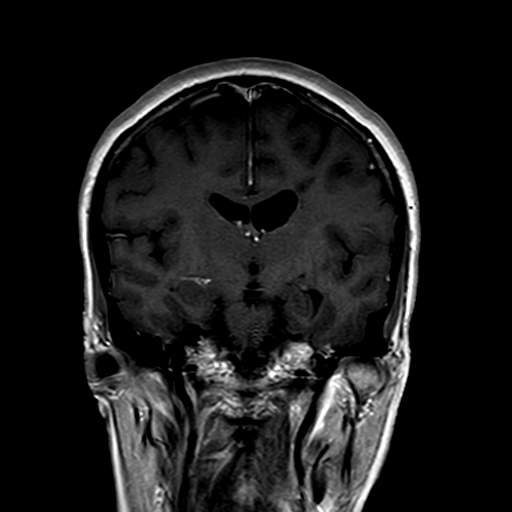
[im 36/36]
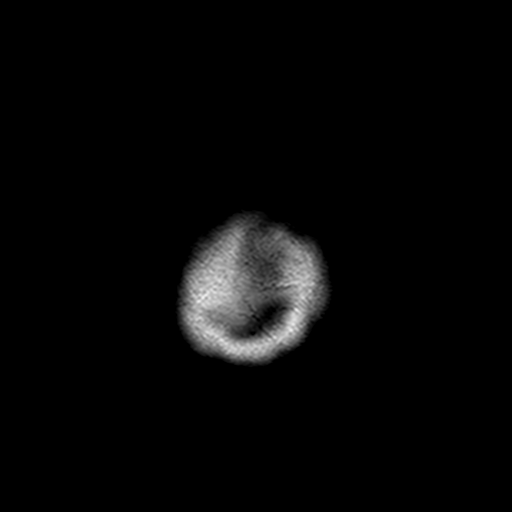

[15 of 48 positions shown; findings below may reference images not displayed]

FINDINGS: -------------------------------------------------------------------------------- 
------------------------- 
INTRACRANIAL: 
Redemonstrated are foci and confluent areas of T2 prolongation in the 
periventricular and deep white matter consistent with known history of multiple 
sclerosis. This is progressed along the left greater than right periatrial white 
matter. Additional progression along the left periventricular white matter 
(image 18 of axial FLAIR sequence). No pathologic enhancement. 
No acute ischemia. No abnormal foci of susceptibility artifact in the brain. 
Patency of intracranial vascular flow voids. No acute intracranial hemorrhage, 
mass effect, midline shift.  
-------------------------------------------------------------------------------- 
----------------------- 
OTHER: 
ORBITS/SINUSES/T-BONES:  Visualized orbits show no acute abnormality or mass.  
Mastoid air cells and middle ear cavities are grossly clear.  Visualized 
paranasal sinuses are clear. 
MARROW SIGNAL/SOFT TISSUES: No focal suspect signal abnormality. 
-------------------------------------------------------------------------------- 
-------------------
IMPRESSION: Progression of multiple sclerosis in the brain. No pathologic enhancement to 
suggest active demyelination.
